# Patient Record
Sex: Male | Born: 1965
Health system: Southern US, Community
[De-identification: ages and names within clinical notes are randomized; demographics above are authoritative.]

## PROBLEM LIST (undated history)

## (undated) DIAGNOSIS — M069 Rheumatoid arthritis, unspecified: Secondary | ICD-10-CM

## (undated) DIAGNOSIS — M1612 Unilateral primary osteoarthritis, left hip: Secondary | ICD-10-CM

## (undated) DIAGNOSIS — Z96652 Presence of left artificial knee joint: Principal | ICD-10-CM

## (undated) DIAGNOSIS — I10 Essential (primary) hypertension: Secondary | ICD-10-CM

## (undated) DIAGNOSIS — T4145XA Adverse effect of unspecified anesthetic, initial encounter: Secondary | ICD-10-CM

## (undated) DIAGNOSIS — M1990 Unspecified osteoarthritis, unspecified site: Secondary | ICD-10-CM

## (undated) HISTORY — PX: CARPAL TUNNEL RELEASE: SHX101

## (undated) HISTORY — DX: Presence of left artificial knee joint: Z96.652

## (undated) HISTORY — PX: ROTATOR CUFF REPAIR: SHX139

## (undated) HISTORY — DX: Rheumatoid arthritis, unspecified: M06.9

## (undated) HISTORY — PX: HIP SURGERY: SHX245

## (undated) HISTORY — DX: Unilateral primary osteoarthritis, left hip: M16.12

---

## 2002-05-15 ENCOUNTER — Encounter: Payer: Self-pay | Admitting: Emergency Medicine

## 2002-05-15 ENCOUNTER — Emergency Department (HOSPITAL_COMMUNITY): Admission: EM | Admit: 2002-05-15 | Discharge: 2002-05-15 | Payer: Self-pay | Admitting: Emergency Medicine

## 2002-05-28 ENCOUNTER — Emergency Department (HOSPITAL_COMMUNITY): Admission: EM | Admit: 2002-05-28 | Discharge: 2002-05-28 | Payer: Self-pay | Admitting: *Deleted

## 2006-02-13 ENCOUNTER — Encounter: Admission: RE | Admit: 2006-02-13 | Discharge: 2006-02-13 | Payer: Self-pay | Admitting: Family Medicine

## 2006-03-10 ENCOUNTER — Encounter (INDEPENDENT_AMBULATORY_CARE_PROVIDER_SITE_OTHER): Payer: Self-pay | Admitting: Internal Medicine

## 2007-05-27 ENCOUNTER — Ambulatory Visit: Payer: Self-pay | Admitting: Internal Medicine

## 2007-07-04 ENCOUNTER — Ambulatory Visit: Payer: Self-pay | Admitting: *Deleted

## 2007-07-04 ENCOUNTER — Ambulatory Visit: Payer: Self-pay | Admitting: Internal Medicine

## 2007-07-04 DIAGNOSIS — M25569 Pain in unspecified knee: Secondary | ICD-10-CM | POA: Insufficient documentation

## 2007-07-04 DIAGNOSIS — G5600 Carpal tunnel syndrome, unspecified upper limb: Secondary | ICD-10-CM | POA: Insufficient documentation

## 2007-07-04 DIAGNOSIS — I10 Essential (primary) hypertension: Secondary | ICD-10-CM | POA: Insufficient documentation

## 2007-07-28 ENCOUNTER — Ambulatory Visit: Payer: Self-pay | Admitting: Nurse Practitioner

## 2007-07-28 DIAGNOSIS — M4714 Other spondylosis with myelopathy, thoracic region: Secondary | ICD-10-CM

## 2007-07-28 DIAGNOSIS — M5020 Other cervical disc displacement, unspecified cervical region: Secondary | ICD-10-CM | POA: Insufficient documentation

## 2007-08-18 ENCOUNTER — Ambulatory Visit: Payer: Self-pay | Admitting: Nurse Practitioner

## 2007-09-05 ENCOUNTER — Ambulatory Visit: Payer: Self-pay | Admitting: Nurse Practitioner

## 2007-09-05 LAB — CONVERTED CEMR LAB
ALT: 26 units/L (ref 0–53)
AST: 20 units/L (ref 0–37)
Alkaline Phosphatase: 67 units/L (ref 39–117)
Basophils Absolute: 0 10*3/uL (ref 0.0–0.1)
Basophils Relative: 0 % (ref 0–1)
Eosinophils Absolute: 0.1 10*3/uL (ref 0.0–0.7)
Eosinophils Relative: 1 % (ref 0–5)
HCT: 42.6 % (ref 39.0–52.0)
LDL Cholesterol: 92 mg/dL (ref 0–99)
Lymphocytes Relative: 36 % (ref 12–46)
MCHC: 33.6 g/dL (ref 30.0–36.0)
MCV: 80.8 fL (ref 78.0–100.0)
Platelets: 298 10*3/uL (ref 150–400)
RDW: 14.6 % — ABNORMAL HIGH (ref 11.5–14.0)
Sodium: 141 meq/L (ref 135–145)
Total Bilirubin: 0.7 mg/dL (ref 0.3–1.2)
Total CHOL/HDL Ratio: 4.4
Total Protein: 7.2 g/dL (ref 6.0–8.3)
VLDL: 38 mg/dL (ref 0–40)

## 2007-09-08 ENCOUNTER — Encounter (INDEPENDENT_AMBULATORY_CARE_PROVIDER_SITE_OTHER): Payer: Self-pay | Admitting: Nurse Practitioner

## 2007-10-20 ENCOUNTER — Ambulatory Visit: Payer: Self-pay | Admitting: Nurse Practitioner

## 2007-10-20 DIAGNOSIS — G44229 Chronic tension-type headache, not intractable: Secondary | ICD-10-CM

## 2007-10-31 ENCOUNTER — Telehealth (INDEPENDENT_AMBULATORY_CARE_PROVIDER_SITE_OTHER): Payer: Self-pay | Admitting: Nurse Practitioner

## 2007-11-03 ENCOUNTER — Telehealth (INDEPENDENT_AMBULATORY_CARE_PROVIDER_SITE_OTHER): Payer: Self-pay | Admitting: Nurse Practitioner

## 2008-04-09 ENCOUNTER — Ambulatory Visit: Payer: Self-pay | Admitting: Nurse Practitioner

## 2008-04-09 DIAGNOSIS — M2550 Pain in unspecified joint: Secondary | ICD-10-CM | POA: Insufficient documentation

## 2008-04-09 DIAGNOSIS — M25559 Pain in unspecified hip: Secondary | ICD-10-CM

## 2008-04-12 ENCOUNTER — Encounter (INDEPENDENT_AMBULATORY_CARE_PROVIDER_SITE_OTHER): Payer: Self-pay | Admitting: Nurse Practitioner

## 2008-04-12 LAB — CONVERTED CEMR LAB
Anti Nuclear Antibody(ANA): NEGATIVE
Basophils Relative: 0 % (ref 0–1)
Lymphs Abs: 3.3 10*3/uL (ref 0.7–4.0)
Monocytes Relative: 7 % (ref 3–12)
Neutro Abs: 5.6 10*3/uL (ref 1.7–7.7)
Neutrophils Relative %: 58 % (ref 43–77)
Platelets: 301 10*3/uL (ref 150–400)
RBC: 5.55 M/uL (ref 4.22–5.81)
Rhuematoid fact SerPl-aCnc: 176 intl units/mL — ABNORMAL HIGH (ref 0–20)
WBC: 9.7 10*3/uL (ref 4.0–10.5)

## 2008-04-29 ENCOUNTER — Ambulatory Visit: Payer: Self-pay | Admitting: Nurse Practitioner

## 2008-04-29 LAB — CONVERTED CEMR LAB
CRP: 1 mg/dL — ABNORMAL HIGH (ref <0.6)
Hep A Total Ab: NEGATIVE
ds DNA Ab: <1 (ref <5)

## 2008-05-04 ENCOUNTER — Encounter (INDEPENDENT_AMBULATORY_CARE_PROVIDER_SITE_OTHER): Payer: Self-pay | Admitting: Nurse Practitioner

## 2008-05-06 ENCOUNTER — Encounter: Admission: RE | Admit: 2008-05-06 | Discharge: 2008-05-06 | Payer: Self-pay | Admitting: Internal Medicine

## 2008-05-20 ENCOUNTER — Encounter (INDEPENDENT_AMBULATORY_CARE_PROVIDER_SITE_OTHER): Payer: Self-pay | Admitting: Nurse Practitioner

## 2008-05-24 ENCOUNTER — Ambulatory Visit: Payer: Self-pay | Admitting: Nurse Practitioner

## 2008-08-04 ENCOUNTER — Ambulatory Visit: Payer: Self-pay | Admitting: Nurse Practitioner

## 2008-08-12 ENCOUNTER — Ambulatory Visit: Payer: Self-pay | Admitting: Nurse Practitioner

## 2008-10-06 ENCOUNTER — Encounter (INDEPENDENT_AMBULATORY_CARE_PROVIDER_SITE_OTHER): Payer: Self-pay | Admitting: Nurse Practitioner

## 2008-10-28 ENCOUNTER — Ambulatory Visit: Payer: Self-pay | Admitting: Nurse Practitioner

## 2008-10-28 DIAGNOSIS — R609 Edema, unspecified: Secondary | ICD-10-CM

## 2008-11-01 ENCOUNTER — Encounter (INDEPENDENT_AMBULATORY_CARE_PROVIDER_SITE_OTHER): Payer: Self-pay | Admitting: Nurse Practitioner

## 2008-11-01 LAB — CONVERTED CEMR LAB
ALT: 25 U/L
AST: 19 U/L
Albumin: 4.3 g/dL
Alkaline Phosphatase: 65 U/L
BUN: 15 mg/dL
Basophils Absolute: 0 10*3/uL
Basophils Relative: 0 %
CO2: 27 meq/L
Calcium: 10.1 mg/dL
Chloride: 103 meq/L
Creatinine, Ser: 0.88 mg/dL
Eosinophils Absolute: 0.1 10*3/uL
Eosinophils Relative: 1 %
Glucose, Bld: 119 mg/dL — ABNORMAL HIGH
HCT: 43.9 %
Hemoglobin: 14.1 g/dL
Lymphocytes Relative: 42 %
Lymphs Abs: 3.3 10*3/uL
MCHC: 32.1 g/dL
MCV: 83.5 fL
Monocytes Absolute: 0.7 10*3/uL
Monocytes Relative: 9 %
Neutro Abs: 3.8 10*3/uL
Neutrophils Relative %: 49 %
Platelets: 303 10*3/uL
Potassium: 3.9 meq/L
RBC: 5.26 M/uL
RDW: 14.2 %
Sodium: 141 meq/L
TSH: 0.957 u[IU]/mL
Total Bilirubin: 0.7 mg/dL
Total Protein: 7.3 g/dL
WBC: 7.9 10*3/uL

## 2008-11-02 ENCOUNTER — Encounter (INDEPENDENT_AMBULATORY_CARE_PROVIDER_SITE_OTHER): Payer: Self-pay | Admitting: Nurse Practitioner

## 2008-11-02 DIAGNOSIS — E119 Type 2 diabetes mellitus without complications: Secondary | ICD-10-CM

## 2008-11-02 LAB — CONVERTED CEMR LAB: Hgb A1c MFr Bld: 6.6 % — ABNORMAL HIGH (ref 4.6–6.1)

## 2008-11-08 ENCOUNTER — Encounter (INDEPENDENT_AMBULATORY_CARE_PROVIDER_SITE_OTHER): Payer: Self-pay | Admitting: Nurse Practitioner

## 2008-11-09 ENCOUNTER — Encounter (INDEPENDENT_AMBULATORY_CARE_PROVIDER_SITE_OTHER): Payer: Self-pay | Admitting: Nurse Practitioner

## 2008-11-16 ENCOUNTER — Encounter (INDEPENDENT_AMBULATORY_CARE_PROVIDER_SITE_OTHER): Payer: Self-pay | Admitting: Nurse Practitioner

## 2008-12-29 ENCOUNTER — Telehealth (INDEPENDENT_AMBULATORY_CARE_PROVIDER_SITE_OTHER): Payer: Self-pay | Admitting: Nurse Practitioner

## 2008-12-31 ENCOUNTER — Ambulatory Visit: Payer: Self-pay | Admitting: Nurse Practitioner

## 2008-12-31 LAB — CONVERTED CEMR LAB: Blood Glucose, Fingerstick: 108

## 2009-02-08 ENCOUNTER — Ambulatory Visit: Payer: Self-pay | Admitting: Nurse Practitioner

## 2009-02-08 LAB — CONVERTED CEMR LAB: Blood Glucose, Fingerstick: 105

## 2009-02-11 ENCOUNTER — Encounter (INDEPENDENT_AMBULATORY_CARE_PROVIDER_SITE_OTHER): Payer: Self-pay | Admitting: Nurse Practitioner

## 2009-02-15 ENCOUNTER — Encounter: Admission: RE | Admit: 2009-02-15 | Discharge: 2009-04-20 | Payer: Self-pay | Admitting: Internal Medicine

## 2009-02-15 ENCOUNTER — Encounter (INDEPENDENT_AMBULATORY_CARE_PROVIDER_SITE_OTHER): Payer: Self-pay | Admitting: Nurse Practitioner

## 2009-02-16 ENCOUNTER — Encounter (INDEPENDENT_AMBULATORY_CARE_PROVIDER_SITE_OTHER): Payer: Self-pay | Admitting: Nurse Practitioner

## 2009-02-25 ENCOUNTER — Encounter (INDEPENDENT_AMBULATORY_CARE_PROVIDER_SITE_OTHER): Payer: Self-pay | Admitting: Nurse Practitioner

## 2009-03-08 ENCOUNTER — Encounter (INDEPENDENT_AMBULATORY_CARE_PROVIDER_SITE_OTHER): Payer: Self-pay | Admitting: Nurse Practitioner

## 2009-03-10 ENCOUNTER — Ambulatory Visit: Payer: Self-pay | Admitting: Nurse Practitioner

## 2009-03-10 LAB — CONVERTED CEMR LAB
BUN: 14 mg/dL (ref 6–23)
Creatinine, Ser: 0.84 mg/dL (ref 0.40–1.50)
Glucose, Bld: 100 mg/dL — ABNORMAL HIGH (ref 70–99)
Potassium: 3.9 meq/L (ref 3.5–5.3)

## 2009-03-11 ENCOUNTER — Encounter (INDEPENDENT_AMBULATORY_CARE_PROVIDER_SITE_OTHER): Payer: Self-pay | Admitting: Nurse Practitioner

## 2009-03-15 ENCOUNTER — Encounter: Admission: RE | Admit: 2009-03-15 | Discharge: 2009-03-15 | Payer: Self-pay | Admitting: Internal Medicine

## 2009-03-30 ENCOUNTER — Telehealth (INDEPENDENT_AMBULATORY_CARE_PROVIDER_SITE_OTHER): Payer: Self-pay | Admitting: Nurse Practitioner

## 2009-04-06 ENCOUNTER — Encounter (INDEPENDENT_AMBULATORY_CARE_PROVIDER_SITE_OTHER): Payer: Self-pay | Admitting: Nurse Practitioner

## 2009-05-26 ENCOUNTER — Encounter (INDEPENDENT_AMBULATORY_CARE_PROVIDER_SITE_OTHER): Payer: Self-pay | Admitting: Nurse Practitioner

## 2009-06-15 ENCOUNTER — Encounter (INDEPENDENT_AMBULATORY_CARE_PROVIDER_SITE_OTHER): Payer: Self-pay | Admitting: Nurse Practitioner

## 2009-06-15 ENCOUNTER — Ambulatory Visit (HOSPITAL_COMMUNITY): Admission: RE | Admit: 2009-06-15 | Discharge: 2009-06-15 | Payer: Self-pay | Admitting: Gastroenterology

## 2009-06-24 ENCOUNTER — Ambulatory Visit: Payer: Self-pay | Admitting: Nurse Practitioner

## 2009-06-24 LAB — CONVERTED CEMR LAB
Blood Glucose, Fingerstick: 135
Glucose, Urine, Semiquant: NEGATIVE
HDL: 55 mg/dL (ref >39)
LDL Cholesterol: 137 mg/dL — ABNORMAL HIGH (ref 0–99)
Microalb, Ur: 17.01 mg/dL — ABNORMAL HIGH (ref 0.00–1.89)
Nitrite: NEGATIVE
Specific Gravity, Urine: 1.025
Triglycerides: 68 mg/dL (ref <150)
VLDL: 14 mg/dL (ref 0–40)
WBC Urine, dipstick: NEGATIVE
pH: 5.5

## 2009-06-27 ENCOUNTER — Encounter (INDEPENDENT_AMBULATORY_CARE_PROVIDER_SITE_OTHER): Payer: Self-pay | Admitting: Nurse Practitioner

## 2009-06-29 ENCOUNTER — Encounter (INDEPENDENT_AMBULATORY_CARE_PROVIDER_SITE_OTHER): Payer: Self-pay | Admitting: Nurse Practitioner

## 2009-06-30 ENCOUNTER — Ambulatory Visit: Payer: Self-pay | Admitting: Nurse Practitioner

## 2009-07-15 ENCOUNTER — Ambulatory Visit: Payer: Self-pay | Admitting: Nurse Practitioner

## 2009-08-01 ENCOUNTER — Ambulatory Visit: Payer: Self-pay | Admitting: Nurse Practitioner

## 2009-08-08 ENCOUNTER — Encounter (INDEPENDENT_AMBULATORY_CARE_PROVIDER_SITE_OTHER): Payer: Self-pay | Admitting: Nurse Practitioner

## 2009-08-15 ENCOUNTER — Ambulatory Visit: Payer: Self-pay | Admitting: Nurse Practitioner

## 2009-08-15 LAB — CONVERTED CEMR LAB
BUN: 12 mg/dL (ref 6–23)
Creatinine, Ser: 0.81 mg/dL (ref 0.40–1.50)

## 2009-08-16 ENCOUNTER — Encounter (INDEPENDENT_AMBULATORY_CARE_PROVIDER_SITE_OTHER): Payer: Self-pay | Admitting: Nurse Practitioner

## 2009-08-29 ENCOUNTER — Ambulatory Visit: Payer: Self-pay | Admitting: Physician Assistant

## 2009-09-06 ENCOUNTER — Telehealth (INDEPENDENT_AMBULATORY_CARE_PROVIDER_SITE_OTHER): Payer: Self-pay | Admitting: Nurse Practitioner

## 2009-10-12 ENCOUNTER — Ambulatory Visit: Payer: Self-pay | Admitting: Nurse Practitioner

## 2009-10-12 DIAGNOSIS — M25529 Pain in unspecified elbow: Secondary | ICD-10-CM

## 2009-10-12 LAB — CONVERTED CEMR LAB
Blood Glucose, Fingerstick: 107
Hgb A1c MFr Bld: 6.2 %

## 2009-12-02 ENCOUNTER — Ambulatory Visit: Payer: Self-pay | Admitting: Nurse Practitioner

## 2009-12-02 LAB — CONVERTED CEMR LAB: Cholesterol: 162 mg/dL (ref 0–200)

## 2009-12-07 ENCOUNTER — Encounter (INDEPENDENT_AMBULATORY_CARE_PROVIDER_SITE_OTHER): Payer: Self-pay | Admitting: Nurse Practitioner

## 2009-12-14 ENCOUNTER — Encounter (INDEPENDENT_AMBULATORY_CARE_PROVIDER_SITE_OTHER): Payer: Self-pay | Admitting: Nurse Practitioner

## 2010-01-10 ENCOUNTER — Ambulatory Visit: Payer: Self-pay | Admitting: Nurse Practitioner

## 2010-01-10 DIAGNOSIS — H579 Unspecified disorder of eye and adnexa: Secondary | ICD-10-CM

## 2010-01-10 LAB — CONVERTED CEMR LAB
Blood Glucose, Fingerstick: 101
Rapid HIV Screen: NEGATIVE

## 2010-01-11 ENCOUNTER — Encounter (INDEPENDENT_AMBULATORY_CARE_PROVIDER_SITE_OTHER): Payer: Self-pay | Admitting: Nurse Practitioner

## 2010-01-11 LAB — CONVERTED CEMR LAB
Basophils Absolute: 0 10*3/uL (ref 0.0–0.1)
Cholesterol: 190 mg/dL (ref 0–200)
HCT: 44.7 % (ref 39.0–52.0)
HDL: 46 mg/dL (ref >39)
Hemoglobin: 14.6 g/dL (ref 13.0–17.0)
Lymphocytes Relative: 39 % (ref 12–46)
Lymphs Abs: 3.2 10*3/uL (ref 0.7–4.0)
Monocytes Absolute: 0.5 10*3/uL (ref 0.1–1.0)
Monocytes Relative: 6 % (ref 3–12)
Neutro Abs: 4.4 10*3/uL (ref 1.7–7.7)
RBC: 5.51 M/uL (ref 4.22–5.81)
Total CHOL/HDL Ratio: 4.1
Triglycerides: 108 mg/dL (ref <150)
VLDL: 22 mg/dL (ref 0–40)
WBC: 8.1 10*3/uL (ref 4.0–10.5)

## 2010-03-01 ENCOUNTER — Ambulatory Visit: Payer: Self-pay | Admitting: Nurse Practitioner

## 2010-03-01 DIAGNOSIS — N529 Male erectile dysfunction, unspecified: Secondary | ICD-10-CM | POA: Insufficient documentation

## 2010-03-01 LAB — CONVERTED CEMR LAB: Blood Glucose, Fingerstick: 120

## 2010-04-12 ENCOUNTER — Ambulatory Visit: Payer: Self-pay | Admitting: Nurse Practitioner

## 2010-04-13 ENCOUNTER — Encounter (INDEPENDENT_AMBULATORY_CARE_PROVIDER_SITE_OTHER): Payer: Self-pay | Admitting: Nurse Practitioner

## 2010-06-07 ENCOUNTER — Ambulatory Visit: Payer: Self-pay | Admitting: Nurse Practitioner

## 2010-06-07 LAB — CONVERTED CEMR LAB: Blood Glucose, Fingerstick: 104

## 2010-06-26 ENCOUNTER — Ambulatory Visit: Payer: Self-pay | Admitting: Nurse Practitioner

## 2010-06-26 DIAGNOSIS — R109 Unspecified abdominal pain: Secondary | ICD-10-CM

## 2010-06-26 LAB — CONVERTED CEMR LAB
AST: 20 units/L (ref 0–37)
Albumin: 4.2 g/dL (ref 3.5–5.2)
Alkaline Phosphatase: 70 units/L (ref 39–117)
BUN: 13 mg/dL (ref 6–23)
Blood Glucose, Fingerstick: 161
Creatinine, Ser: 0.77 mg/dL (ref 0.40–1.50)
Glucose, Urine, Semiquant: NEGATIVE
Ketones, urine, test strip: NEGATIVE
Nitrite: NEGATIVE
Potassium: 4 meq/L (ref 3.5–5.3)
Specific Gravity, Urine: 1.025

## 2010-06-27 ENCOUNTER — Encounter (INDEPENDENT_AMBULATORY_CARE_PROVIDER_SITE_OTHER): Payer: Self-pay | Admitting: Nurse Practitioner

## 2010-07-12 ENCOUNTER — Telehealth (INDEPENDENT_AMBULATORY_CARE_PROVIDER_SITE_OTHER): Payer: Self-pay | Admitting: Nurse Practitioner

## 2010-07-13 ENCOUNTER — Emergency Department (HOSPITAL_COMMUNITY): Admission: EM | Admit: 2010-07-13 | Discharge: 2010-07-13 | Payer: Self-pay | Admitting: Emergency Medicine

## 2010-07-13 ENCOUNTER — Ambulatory Visit: Payer: Self-pay | Admitting: Nurse Practitioner

## 2010-07-13 DIAGNOSIS — R5383 Other fatigue: Secondary | ICD-10-CM

## 2010-07-13 DIAGNOSIS — F985 Adult onset fluency disorder: Secondary | ICD-10-CM

## 2010-07-13 DIAGNOSIS — R5381 Other malaise: Secondary | ICD-10-CM

## 2010-07-17 ENCOUNTER — Encounter (INDEPENDENT_AMBULATORY_CARE_PROVIDER_SITE_OTHER): Payer: Self-pay | Admitting: Nurse Practitioner

## 2010-07-17 ENCOUNTER — Telehealth (INDEPENDENT_AMBULATORY_CARE_PROVIDER_SITE_OTHER): Payer: Self-pay | Admitting: Nurse Practitioner

## 2010-07-21 ENCOUNTER — Encounter (INDEPENDENT_AMBULATORY_CARE_PROVIDER_SITE_OTHER): Payer: Self-pay | Admitting: Nurse Practitioner

## 2010-08-01 ENCOUNTER — Ambulatory Visit: Payer: Self-pay | Admitting: Nurse Practitioner

## 2010-08-01 DIAGNOSIS — J383 Other diseases of vocal cords: Secondary | ICD-10-CM

## 2010-08-01 LAB — CONVERTED CEMR LAB: Blood Glucose, Fingerstick: 96

## 2010-08-04 ENCOUNTER — Encounter (INDEPENDENT_AMBULATORY_CARE_PROVIDER_SITE_OTHER): Payer: Self-pay | Admitting: Nurse Practitioner

## 2010-08-16 ENCOUNTER — Ambulatory Visit: Payer: Self-pay | Admitting: Nurse Practitioner

## 2010-08-30 ENCOUNTER — Encounter (INDEPENDENT_AMBULATORY_CARE_PROVIDER_SITE_OTHER): Payer: Self-pay | Admitting: Nurse Practitioner

## 2010-09-04 ENCOUNTER — Encounter (INDEPENDENT_AMBULATORY_CARE_PROVIDER_SITE_OTHER): Payer: Self-pay | Admitting: Nurse Practitioner

## 2010-09-11 ENCOUNTER — Encounter (INDEPENDENT_AMBULATORY_CARE_PROVIDER_SITE_OTHER): Payer: Self-pay | Admitting: Nurse Practitioner

## 2010-09-27 ENCOUNTER — Ambulatory Visit: Payer: Self-pay | Admitting: Nurse Practitioner

## 2010-09-27 LAB — CONVERTED CEMR LAB: Blood Glucose, Fingerstick: 121

## 2010-09-29 ENCOUNTER — Encounter (INDEPENDENT_AMBULATORY_CARE_PROVIDER_SITE_OTHER): Payer: Self-pay | Admitting: Nurse Practitioner

## 2010-10-03 ENCOUNTER — Encounter: Admission: RE | Admit: 2010-10-03 | Discharge: 2010-10-26 | Payer: Self-pay | Source: Home / Self Care

## 2010-10-12 ENCOUNTER — Encounter (INDEPENDENT_AMBULATORY_CARE_PROVIDER_SITE_OTHER): Payer: Self-pay | Admitting: Nurse Practitioner

## 2010-10-12 ENCOUNTER — Telehealth (INDEPENDENT_AMBULATORY_CARE_PROVIDER_SITE_OTHER): Payer: Self-pay | Admitting: *Deleted

## 2010-10-31 ENCOUNTER — Encounter: Admission: RE | Admit: 2010-10-31 | Discharge: 2010-11-25 | Payer: Self-pay | Source: Home / Self Care

## 2010-10-31 ENCOUNTER — Encounter (INDEPENDENT_AMBULATORY_CARE_PROVIDER_SITE_OTHER): Payer: Self-pay | Admitting: Nurse Practitioner

## 2010-11-07 ENCOUNTER — Encounter: Admit: 2010-11-07 | Payer: Self-pay

## 2010-11-14 ENCOUNTER — Encounter (INDEPENDENT_AMBULATORY_CARE_PROVIDER_SITE_OTHER): Payer: Self-pay | Admitting: Nurse Practitioner

## 2010-11-19 ENCOUNTER — Encounter: Payer: Self-pay | Admitting: Internal Medicine

## 2010-11-27 ENCOUNTER — Ambulatory Visit
Admission: RE | Admit: 2010-11-27 | Discharge: 2010-11-27 | Payer: Self-pay | Source: Home / Self Care | Attending: Nurse Practitioner | Admitting: Nurse Practitioner

## 2010-11-27 ENCOUNTER — Encounter (INDEPENDENT_AMBULATORY_CARE_PROVIDER_SITE_OTHER): Payer: Self-pay | Admitting: Nurse Practitioner

## 2010-11-27 LAB — CONVERTED CEMR LAB
Glucose, Urine, Semiquant: NEGATIVE
Hgb A1c MFr Bld: 6.2 %
Specific Gravity, Urine: 1.02
WBC Urine, dipstick: NEGATIVE
pH: 7

## 2010-11-28 NOTE — Letter (Signed)
Summary: Generic Letter  [facility]  [address] [place], [place] [address]   Phone: [phone]  Fax: [phone]    06/07/2010  Curtis Mendez [address] [place], [place]  [address]  To Whom it May concern:  Curtis Mendez is an established patient in this office.  He is alert, oriented and neurologically intact.  It has been determined that he is able to make decisions regarding his finances and well being. He does NOT need a payee.  Contact this office if you have any further questions or concerns.     Sincerely,   Lehman Prom FNP [facility]

## 2010-11-28 NOTE — Assessment & Plan Note (Signed)
Summary: 2 WEEK FU FOR BP CHECK//KT  Nurse Visit   Vital Signs:  Patient profile:   45 year old male Pulse rate:   76 / minute Pulse rhythm:   regular Resp:     20 per minute BP sitting:   110 / 80  (left arm) Cuff size:   large  Vitals Entered By: Dutch Quint RN (August 16, 2010 9:44 AM)  Impression & Recommendations:  Problem # 1:  HYPERTENSION, BENIGN ESSENTIAL (ICD-401.1) Took all his medications BP good -- 100/80 Continue meds F/U as scheduled  His updated medication list for this problem includes:    Metoprolol Tartrate 100 Mg Tabs (Metoprolol tartrate) ..... One tablet by mouth two times a day for blood pressure    Lisinopril-hydrochlorothiazide 20-25 Mg Tabs (Lisinopril-hydrochlorothiazide) ..... One tablet by mouth daily for blood pressure    Norvasc 5 Mg Tabs (Amlodipine besylate) .Marland Kitchen... Take one tablet by mouth daily for blood pressure  Complete Medication List: 1)  Metoprolol Tartrate 100 Mg Tabs (Metoprolol tartrate) .... One tablet by mouth two times a day for blood pressure 2)  Omeprazole 20 Mg Cpdr (Omeprazole) .... One tablet by mouth two times a day before meals 3)  Lisinopril-hydrochlorothiazide 20-25 Mg Tabs (Lisinopril-hydrochlorothiazide) .... One tablet by mouth daily for blood pressure 4)  Glucophage Xr 500 Mg Xr24h-tab (Metformin hcl) .Marland Kitchen.. 1 tablet by mouth daily for blood sugar 5)  Glucometer Elite Classic Kit (Blood glucose monitoring suppl) .... Dispense glucometer to check blood sugar daily dx 250.00 6)  Glucometer Elite Test Strp (Glucose blood) .... Use to test blood sugar once daily before breakfast dx 250.00 7)  Norvasc 5 Mg Tabs (Amlodipine besylate) .... Take one tablet by mouth daily for blood pressure 8)  Viagra 100 Mg Tabs (Sildenafil citrate) .... One tablet by mouth 30 minutes  before sexual activity 9)  Voltaren 1 % Gel (Diclofenac sodium) .... Apply 4gm to left hip topically two times a day as needed 10)  Topamax 100 Mg Tabs  (Topiramate) .... 2 tablets by mouth daily for headaches 11)  Nortriptyline Hcl 10 Mg Caps (Nortriptyline hcl) .... 3 capsules by mouth nightly 12)  Vicodin 5-500 Mg Tabs (Hydrocodone-acetaminophen) .... One tablet by mouth daily as needed pain   Review of Systems CV:  Complains of swelling of feet; denies bluish discoloration of lips or nails, chest pain or discomfort, difficulty breathing at night, difficulty breathing while lying down, fainting, fatigue, leg cramps with exertion, lightheadness, near fainting, palpitations, shortness of breath with exertion, swelling of hands, and weight gain; Denies cough, headache or visual changes.   Patient Instructions: 1)  Your blood pressure is good -- 110/80. 2)  Continue medications as ordered. 3)  Keep appointment on Monday 08/21/10 with Dr. Jan Fireman. 4)  Keep appointment with provider on 09/27/10 as scheduled. 5)  Call if anything changes or if you have any questions.   Physical Exam  Lungs:  normal respiratory effort, normal breath sounds, no crackles, and no wheezes.   Heart:  normal rate and regular rhythm.     CC:  BP check.  History of Present Illness: Took all his medications this morning.  States asymptomatic.  CC: BP check Is Patient Diabetic? Yes Did you bring your meter with you today? No Pain Assessment Patient in pain? yes     Location: left hip Intensity: 10 Type: sharp Onset of pain  Constant   Allergies: No Known Drug Allergies  Orders Added: 1)  Est. Patient Level I [21308]

## 2010-11-28 NOTE — Letter (Signed)
Summary: *HSN Results Follow up  [facility]  [address] [place], [place] [address]   Phone: [phone]  Fax: [phone]      04/13/2010   Corin A Ruybal [address] [place], [place]  [address]   Dear  Mr. Calyb Wolfrey,                            ____S.Drinkard,FNP   ____D. Gore,FNP       ____B. McPherson,MD   ____V. Rankins,MD    ____E. Mulberry,MD    _X___N. Daphine Deutscher, FNP  ____D. Reche Dixon, MD    ____K. Philipp Deputy, MD    ____Other     This letter is to inform you that your recent test(s):  _______Pap Smear    ___X____Lab Test     _______X-ray    ___X___ is within acceptable limits  _______ requires a medication change  _______ requires a follow-up lab visit  _______ requires a follow-up visit with your provider   Comments: Your Hbga1c = 6.2 during your recent office visit.  Remember this value should be less than 7.  A value of 6.2 means your diabetes is controlled.  Continue your current medications and keep up your efforts at diet and exercise.     _________________________________________________________ If you have any questions, please contact our office [phone].                    Sincerely,    Lehman Prom FNP [facility]

## 2010-11-28 NOTE — Progress Notes (Signed)
  Phone Note Outgoing Call   Summary of Call: CALLED PT TO SEE IF HE WAS ACUTLY ILL IF NOT HE CAN CANCELL APP FOR    9/15/ AND GO OUT 8 MORE WEEKS.HE WAS JUST SEEN 8 29/11 THIS IS PER [facility] Initial call taken by: Arta Bruce,  July 12, 2010 3:14 PM  Follow-up for Phone Call        MR [place] CALLED BACK AND SAYS THAT HE IS GOING TO KEEP HIS APPOINTMENT FOR TOMORROW. Follow-up by: Leodis Rains,  July 12, 2010 4:10 PM

## 2010-11-28 NOTE — Assessment & Plan Note (Signed)
Summary: Diabetes/HTN   Vital Signs:  Patient profile:   45 year old male Weight:      305.1 pounds Temp:     98.2 degrees F oral Pulse rate:   90 / minute Pulse rhythm:   regular Resp:     20 per minute Cuff size:   large  Vitals Entered By: Levon Hedger (April 12, 2010 9:02 AM) CC: follow-up visit DM, Hypertension Management, Abdominal Pain Is Patient Diabetic? Yes Pain Assessment Patient in pain? no      CBG Result 132 CBG Device ID A  Does patient need assistance? Functional Status Self care Ambulation Normal   CC:  follow-up visit DM, Hypertension Management, and Abdominal Pain.  History of Present Illness:  pt into the office for follow up on diabetes  Obesity - Down 3 pounds since his last visit. Pt is making a hard effort to decrease the weight  Diabetes Management History:      The patient is a 45 years old male who comes in for evaluation of Type 2 Diabetes Mellitus.  He has not been enrolled in the "Diabetic Education Program".  He states understanding of dietary principles and is following his diet appropriately.  No sensory loss is reported.  Self foot exams are not being performed.  He is not checking home blood sugars.  He says that he is exercising.        Hypoglycemic symptoms are not occurring.  No hyperglycemic symptoms are reported.  Other comments include: Pt is taking medications as ordered.        There are no symptoms to suggest diabetic complications.  No changes have been made to his treatment plan since last visit.    Dyspepsia History:      There is a prior history of GERD.  The patient does not have a prior history of documented ulcer disease.  The dominant symptom is not heartburn or acid reflux.  An H-2 blocker medication is not currently being taken.  No previous upper endoscopy has been done.    Hypertension History:      He denies headache, chest pain, and palpitations.  pt is taking medications as ordered.        Positive major  cardiovascular risk factors include diabetes and hypertension.  Negative major cardiovascular risk factors include male age less than 51 years old and non-tobacco-user status.        Further assessment for target organ damage reveals no history of ASHD, cardiac end-organ damage (CHF/LVH), stroke/TIA, peripheral vascular disease, renal insufficiency, or hypertensive retinopathy.      Habits & Providers  Alcohol-Tobacco-Diet     Alcohol drinks/day: 0     Tobacco Status: never  Exercise-Depression-Behavior     Does Patient Exercise: yes     Exercise Counseling: to improve exercise regimen     Have you felt down or hopeless? yes     Have you felt little pleasure in things? yes     Depression Counseling: not indicated; screening negative for depression     Drug Use: never     Seat Belt Use: 100     Sun Exposure: occasionally  Allergies (verified): No Known Drug Allergies  Review of Systems CV:  Denies chest pain or discomfort. Resp:  Denies cough. GI:  Denies abdominal pain, nausea, and vomiting. GU:  Complains of erectile dysfunction. MS:  Left hip - s/p ortho procedure.  Still with complaints of tingling and numbness.  Pt has tried neurontin as ordered without  benefit. Neuro:  Complains of tingling.  Physical Exam  General:  alert.  obese Head:  normocephalic.   Lungs:  normal breath sounds.   Heart:  normal rate and regular rhythm.   Neurologic:  gait normal.   Skin:  normal Psych:  Oriented X3.    Diabetes Management Exam:    Foot Exam (with socks and/or shoes not present):       Sensory-Monofilament:          Left foot: normal          Right foot: normal       Nails:          Left foot: normal          Right foot: normal   Impression & Recommendations:  Problem # 1:  DIABETES MELLITUS (ICD-250.00) will check Hba1c today Will notify pt of the results His updated medication list for this problem includes:    Lisinopril-hydrochlorothiazide 20-25 Mg Tabs  (Lisinopril-hydrochlorothiazide) ..... One tablet by mouth daily for blood pressure    Glucophage Xr 500 Mg Xr24h-tab (Metformin hcl) .Marland Kitchen... 1 tablet by mouth daily for blood sugar  Orders: Capillary Blood Glucose/CBG (82948) Hgb A1C (16967EL)  Problem # 2:  HYPERTENSION, BENIGN ESSENTIAL (ICD-401.1)  His updated medication list for this problem includes:    Metoprolol Tartrate 100 Mg Tabs (Metoprolol tartrate) ..... One tablet by mouth two times a day for blood pressure    Lisinopril-hydrochlorothiazide 20-25 Mg Tabs (Lisinopril-hydrochlorothiazide) ..... One tablet by mouth daily for blood pressure    Norvasc 5 Mg Tabs (Amlodipine besylate) .Marland Kitchen... 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 One tablet by mouth two times a day for blood pressure 2)  Omeprazole 20 Mg Cpdr (Omeprazole) .... One tablet by mouth two times a day before meals 3)  Lisinopril-hydrochlorothiazide 20-25 Mg Tabs (Lisinopril-hydrochlorothiazide) .... One tablet by mouth daily for blood pressure 4)  Glucophage Xr 500 Mg Xr24h-tab (Metformin hcl) .Marland Kitchen.. 1 tablet by mouth daily for blood sugar 5)  Glucometer Elite Classic Kit (Blood glucose monitoring suppl) .... Dispense glucometer to check blood sugar daily dx 250.00 6)  Glucometer Elite Test Strp (Glucose blood) .... Use to test blood sugar once daily before breakfast dx 250.00 7)  Norvasc 5 Mg Tabs (Amlodipine besylate) .... Take one tablet by  mouth daily for blood pressure 8)  Gabapentin 300 Mg Caps (Gabapentin) .... Three capsules by mouth nightly for left hip 9)  Cialis 5 Mg Tabs (Tadalafil) .... Two tablets by mouth as needed 30 minutes before sexual activity  Diabetes Management Assessment/Plan:      His blood pressure goal is < 130/80.    Hypertension Assessment/Plan:      The patient's hypertensive risk group is category C: Target organ damage and/or diabetes.  His calculated 10 year risk of coronary heart disease is 11 %.  His blood pressure goal is < 130/80.  Patient Instructions: 1)  Weight loss -  down 3 pounds since last visit.  Continue your efforts at exercise and diet. 2)  Blood sugar - Your Hgba1c will be checked today.  The goal is less than 7 3)  Follow up in 3 months for diabetes.    Last LDL:                                                 122 (01/11/2010 3:29:00 AM)        Diabetic Foot Exam    10-g (5.07) Semmes-Weinstein Monofilament Test Performed by: Levon Hedger          Right Foot          Left Foot Visual Inspection               Test Control      normal         normal Site 1         normal         normal Site 2         normal         normal Site 3         normal         normal Site 4         normal         normal Site 5         normal         normal Site 6         normal         normal Site 7         normal         normal Site 8         normal         normal Site 9         normal         normal Site 10         normal         normal  Impression      normal         normal  Appended Document: Diabetes/HTN   Vital Signs:  Patient profile:   45 year old male BP sitting:   134 / 87  (left arm) Cuff size:   large  Vitals Entered By: Levon Hedger (April 12, 2010 11:01 AM)

## 2010-11-28 NOTE — Progress Notes (Signed)
Summary: papers faxed  Phone Note Call from Patient   Summary of Call: PT FAXED PAPERS OVER FRIDAY/MOTHER CALLING TO SEE WERE WE GO FROM HERE//[phone] Initial call taken by: Arta Bruce,  July 17, 2010 10:11 AM  Follow-up for Phone Call        forward to N. Daphine Deutscher, fnp Follow-up by: Levon Hedger,  July 17, 2010 11:12 AM  Additional Follow-up for Phone Call Additional follow up Details #1::        1.  Pt needs neurology referral ASAP - order done. Look under order tab and print.  Call [facility] and see when pt can get an appt.  Fax office visits and other info to that office that they need 2.  Restart topamax - Rx in basket. Fax to pt's pharmacy 3.  Does pt have a f/u appt with me? if not, make within the next week Additional Follow-up by: Lehman Prom FNP,  July 18, 2010 9:21 AM    Additional Follow-up for Phone Call Additional follow up Details #2::    Appt. made for 07/31/10 per pt. request.  Rx faxed to [facility] on [address].  Records faxed to [facility]. Left message with Diane/GN to return my call to make an appt.  Dutch Quint RN  July 18, 2010 2:59 PM  Diane from [facility] called with appt. information.  Pt. is to see Dr. Anne Hahn at [facility] on Friday, July 21, 2010 at 8:30 am.  Pt. has been notified of appt. Follow-up by: Dutch Quint RN,  July 19, 2010 12:38 PM  Additional Follow-up for Phone Call Additional follow up Details #3:: Details for Additional Follow-up Action Taken: noted Additional Follow-up by: Lehman Prom FNP,  July 19, 2010 1:59 PM  New/Updated Medications: TOPAMAX 100 MG TABS (TOPIRAMATE) 2 tablets by mouth daily for headaches Prescriptions: TOPAMAX 100 MG TABS (TOPIRAMATE) 2 tablets by mouth daily for headaches  #60 x 5   Entered and Authorized by:   Lehman Prom FNP   Signed by:   Lehman Prom FNP on 07/18/2010   Method used:   Printed then faxed to ...         RxID:    [id number]

## 2010-11-28 NOTE — Letter (Signed)
Summary: *HSN Results Follow up  [facility]  [address] [place], [place] [address]   Phone: [phone]  Fax: [phone]      01/11/2010   Arvil A Smart [address] [place], [place]  [address]   Dear  Mr. Choya Chamberlin,                            ____S.Drinkard,FNP   ____D. Gore,FNP       ____B. McPherson,MD   ____V. Rankins,MD    ____E. Mulberry,MD    __X__N. Daphine Deutscher, FNP  ____D. Reche Dixon, MD    ____K. Philipp Deputy, MD    ____Other     This letter is to inform you that your recent test(s):  Labs   Comments:  Your Hgba1c (3 month blood sugar test) is 6.5.  You should stay on your blood sugar medications for now.  Your cholesterol is slightly elevated than when last checked.  You are not on any current medications.  Please be mindful of the fried fatty foods you are eating.  Your cholesterol will be rechecked in 6 months.     _________________________________________________________ If you have any questions, please contact our office [phone].                    Sincerely,    Lehman Prom FNP [facility]

## 2010-11-28 NOTE — Op Note (Signed)
Summary: COLONOSCOPY  2010  COLONOSCOPY  2010   Imported By: Arta Bruce 11/22/2009 14:06:53  _____________________________________________________________________  External Attachment:    Type:   Image     Comment:   External Document

## 2010-11-28 NOTE — Letter (Signed)
[facility]   Imported By: Arta Bruce 02/22/2010 14:24:04  _____________________________________________________________________  External Attachment:    Type:   Image     Comment:   External Document

## 2010-11-28 NOTE — Letter (Signed)
Summary: [facility] EVAL.JAKE RICKETSON,PSY.D  [facility] EVAL.JAKE RICKETSON,PSY.D   Imported By: Arta Bruce 09/27/2010 09:22:40  _____________________________________________________________________  External Attachment:    Type:   Image     Comment:   External Document

## 2010-11-28 NOTE — Letter (Signed)
Summary: REFERRAL/NEUROLOGY//APPT DATE & TIME  REFERRAL/NEUROLOGY//APPT DATE & TIME   Imported By: Arta Bruce 07/20/2010 14:39:32  _____________________________________________________________________  External Attachment:    Type:   Image     Comment:   External Document

## 2010-11-28 NOTE — Letter (Signed)
Summary: *HSN Results Follow up  [facility]  [address] [place], [place] [address]   Phone: [phone]  Fax: [phone]      06/27/2010   Lean A Bazin [address] [place], [place]  [address]   Dear  Mr. Riordan Ardito,                            ____S.Drinkard,FNP   ____D. Gore,FNP       ____B. McPherson,MD   ____V. Rankins,MD    ____E. Mulberry,MD    __X__N. Daphine Deutscher, FNP  ____D. Reche Dixon, MD    ____K. Philipp Deputy, MD    ____Other     This letter is to inform you that your recent test(s):  _______Pap Smear    ___X____Lab Test     _______X-ray    ___X____ is within acceptable limits  _______ requires a medication change  _______ requires a follow-up lab visit  _______ requires a follow-up visit with your provider   Comments: Labs done during recent office visit are normal.       _________________________________________________________ If you have any questions, please contact our office [phone].                    Sincerely,    Lehman Prom FNP [facility]

## 2010-11-28 NOTE — Letter (Signed)
Summary: THERAPY REFERRAL REQUEST  THERAPY REFERRAL REQUEST   Imported By: Arta Bruce 09/27/2010 15:11:38  _____________________________________________________________________  External Attachment:    Type:   Image     Comment:   External Document

## 2010-11-28 NOTE — Letter (Signed)
Summary: MAILED REQUESTED RECORDS TO [facility]  MAILED REQUESTED RECORDS TO [facility]   Imported By: Arta Bruce 08/04/2010 12:40:50  _____________________________________________________________________  External Attachment:    Type:   Image     Comment:   External Document

## 2010-11-28 NOTE — Letter (Signed)
Summary: Lipid Letter  [facility]  [address] [place], [place] [address]   Phone: [phone]  Fax: [phone]    12/07/2009  Curtis Mendez [address] [place], [place]  [address]  Dear [name]:  We have carefully reviewed your last lipid profile from 12/02/2009 and the results are noted below with a summary of recommendations for lipid management.    Cholesterol:       162     Goal: less than 200   HDL "good" Cholesterol:   41     Goal: greater than 40   LDL "bad" Cholesterol:   97     Goal: less than 70   Triglycerides:       119     Goal: less than 150    Your cholesterol is doing ok.    Current Medications: 1)    Metoprolol Tartrate 100 Mg Tabs (Metoprolol tartrate) .... One tablet by mouth two times a day for blood pressure 2)    Omeprazole 20 Mg Cpdr (Omeprazole) .... One tablet by mouth two times a day before meals 3)    Percocet 5-325 Mg  Tabs (Oxycodone-acetaminophen) .Marland Kitchen.. 1 tablet by mouth every 6 hours as needed for pain 4)    Topamax 100 Mg  Tabs (Topiramate) .... 2 tablets by mouth daily for headache 5)    Topamax 25 Mg  Tabs (Topiramate) .Marland Kitchen.. 1 tablet by mouth daily for headache **take in combination with 200mg  daily** 6)    Lisinopril-hydrochlorothiazide 20-25 Mg Tabs (Lisinopril-hydrochlorothiazide) .... One tablet by mouth daily for blood pressure 7)    Glucophage Xr 500 Mg Xr24h-tab (Metformin hcl) .Marland Kitchen.. 1 tablet by mouth daily for blood sugar 8)    Glucometer Elite Classic  Kit (Blood glucose monitoring suppl) .... Dispense glucometer to check blood sugar daily dx 250.00 9)    Glucometer Elite Test  Strp (Glucose blood) .... Use to test blood sugar once daily before breakfast dx 250.00 10)    Norvasc 5 Mg Tabs (Amlodipine besylate) .... Take one tablet by mouth daily for blood pressure  If you have any questions, please call. We appreciate being able to work with you.   Sincerely,    [facility] Lehman Prom FNP

## 2010-11-28 NOTE — Assessment & Plan Note (Signed)
Summary: Left hip pain   Vital Signs:  Patient profile:   45 year old male Weight:      308.9 pounds BMI:     51.59 BSA:     2.38 Temp:     97.7 degrees F oral Pulse rate:   79 / minute Pulse rhythm:   regular Resp:     20 per minute BP sitting:   144 / 82  (left arm) Cuff size:   large  Vitals Entered By: Levon Hedger (Mar 01, 2010 8:42 AM) CC: still having pain in left hip and the Gavapentin does not seem to be giving him any reliief      , Hypertension Management Is Patient Diabetic? Yes Pain Assessment Patient in pain? no      CBG Result 120 CBG Device ID A  Does patient need assistance? Functional Status Self care Ambulation Normal   CC:  still having pain in left hip and the Gavapentin does not seem to be giving him any reliief       and Hypertension Management.  History of Present Illness:  Pt into the office for follow up on left hip tingling . S/p operation on the left hip and the" bone" is doing well. Pt was started on neurontin at his last visit and did increase to two tablets by mouth nightly. Pt prefers to sleep on his left hip at night but admits that this causes more pain. Activity during the day is not restricted  Hypertension History:      He denies headache, chest pain, and palpitations.  He notes no problems with any antihypertensive medication side effects.        Positive major cardiovascular risk factors include diabetes and hypertension.  Negative major cardiovascular risk factors include male age less than 43 years old and non-tobacco-user status.        Further assessment for target organ damage reveals no history of ASHD, cardiac end-organ damage (CHF/LVH), stroke/TIA, peripheral vascular disease, renal insufficiency, or hypertensive retinopathy.    Allergies (verified): No Known Drug Allergies  Review of Systems CV:  Denies chest pain or discomfort and swelling of feet. Resp:  Denies cough. GI:  Denies abdominal pain, nausea, and  vomiting. GU:  Complains of erectile dysfunction; Pt has desire but he does is not able to perform.  He has recently reconnected with his wife and would like to perform. No sexually activity since 2007.. MS:  Complains of joint pain.  Physical Exam  General:  alert.  obese Head:  normocephalic.   Neurologic:  alert & oriented X3.   Skin:  color normal.   Psych:  Oriented X3.     Impression & Recommendations:  Problem # 1:  HIP PAIN, LEFT (ICD-719.45) advised pt to increase neurontin to 3 capsules by mouth nightly avoid sleeping on left hip if possible His updated medication list for this problem includes:    Percocet 5-325 Mg Tabs (Oxycodone-acetaminophen) .Marland Kitchen... 1 tablet by mouth every 6 hours as needed for pain  Problem # 2:  ERECTILE DYSFUNCTION, SECONDARY TO MEDICATION (ZOX-096.04)  pt has a handout with a complete review of ED so pt is well imformed will start on cialis  His updated medication list for this problem includes:    Cialis 20 Mg Tabs (Tadalafil) ..... One tablet by mouth 30 minutes before sexual activity  Problem # 3:  DIABETES MELLITUS (ICD-250.00) stable His updated medication list for this problem includes:    Lisinopril-hydrochlorothiazide 20-25 Mg Tabs (  Lisinopril-hydrochlorothiazide) ..... One tablet by mouth daily for blood pressure    Glucophage Xr 500 Mg Xr24h-tab (Metformin hcl) .Marland Kitchen... 1 tablet by mouth daily for blood sugar  Orders: Capillary Blood Glucose/CBG (16109)  Problem # 4:  HYPERTENSION, BENIGN ESSENTIAL (ICD-401.1) Bp is slightly elevated today will continue to monitor His updated medication list for this problem includes:    Metoprolol Tartrate 100 Mg Tabs (Metoprolol tartrate) ..... One tablet by mouth two times a day for blood pressure    Lisinopril-hydrochlorothiazide 20-25 Mg Tabs (Lisinopril-hydrochlorothiazide) ..... One tablet by mouth daily for blood pressure    Norvasc 5 Mg Tabs (Amlodipine besylate) .Marland Kitchen... Take one tablet by  mouth daily for blood pressure  Complete Medication List: 1)  Metoprolol Tartrate 100 Mg Tabs (Metoprolol tartrate) .... One tablet by mouth two times a day for blood pressure 2)  Omeprazole 20 Mg Cpdr (Omeprazole) .... One tablet by mouth two times a day before meals 3)  Percocet 5-325 Mg Tabs (Oxycodone-acetaminophen) .Marland Kitchen.. 1 tablet by mouth every 6 hours as needed for pain 4)  Lisinopril-hydrochlorothiazide 20-25 Mg Tabs (Lisinopril-hydrochlorothiazide) .... One tablet by mouth daily for blood pressure 5)  Glucophage Xr 500 Mg Xr24h-tab (Metformin hcl) .Marland Kitchen.. 1 tablet by mouth daily for blood sugar 6)  Glucometer Elite Classic Kit (Blood glucose monitoring suppl) .... Dispense glucometer to check blood sugar daily dx 250.00 7)  Glucometer Elite Test Strp (Glucose blood) .... Use to test blood sugar once daily before breakfast dx 250.00 8)  Norvasc 5 Mg Tabs (Amlodipine besylate) .... Take one tablet by mouth daily for blood pressure 9)  Gabapentin 300 Mg Caps (Gabapentin) .... Three capsules by mouth nightly for left hip 10)  Cialis 20 Mg Tabs (Tadalafil) .... One tablet by mouth 30 minutes before sexual activity  Hypertension Assessment/Plan:      The patient's hypertensive risk group is category C: Target organ damage and/or diabetes.  His calculated 10 year risk of coronary heart disease is 11 %.  Today's blood pressure is 144/82.  His blood pressure goal is < 130/80.   Patient Instructions: 1)  This provider will look up more information on "injection" 2)  Keep your next appointment for next month as scheduled 3)  Your blood pressure is slightly elevated today.  Will continue to monitor. 4)  Left hip pain - Increase neurontin to 3 capsules nightly Prescriptions: CIALIS 20 MG TABS (TADALAFIL) One tablet by mouth 30 minutes before sexual activity  #10 x 0   Entered and Authorized by:   Lehman Prom FNP   Signed by:   Lehman Prom FNP on 03/01/2010   Method used:   Print then Give  to Patient   RxID:   [id number]

## 2010-11-28 NOTE — Letter (Signed)
Summary: Handout Printed  Printed Handout:  Radio producer

## 2010-11-28 NOTE — Assessment & Plan Note (Signed)
Summary: Left Flank Pain   Vital Signs:  Patient profile:   45 year old male Weight:      304.7 pounds Temp:     98.3 degrees F oral Pulse rate:   72 / minute Pulse rhythm:   regular Resp:     16 per minute BP sitting:   122 / 92  (left arm) Cuff size:   large  Vitals Entered By: Levon Hedger (June 26, 2010 8:34 AM) CC: x 1 left side lower kidney pain and very light urination with a catch in his side., Hypertension Management Is Patient Diabetic? Yes Pain Assessment Patient in pain? yes     Location: lower back CBG Result 161 CBG Device ID B  Does patient need assistance? Functional Status Self care Ambulation Normal   CC:  x 1 left side lower kidney pain and very light urination with a catch in his side. and Hypertension Management.  History of Present Illness:  Pt into the office for f/u on the left flank pain. Started 1 week ago and was strongest at that time. Lots of pressure with voiding which is new for the pt. He applied an ice pack to the area which did alleviate the pain. He also drank plenty of water. Currently the pain has subsided. Urine is always clear and he has not noted blood in his urine. No fever No nausea and vomiting No previous history of kidney stones  Obesity - He has started to walk on the Treadmill for 45 minutes two times a day.  He did the activity on Mon, Tuesday, and wednesday but then pain started on Thursday and he has not used the treadmill since.  Diabetes Management History:      The patient is a 45 years old male who comes in for evaluation of Type 2 Diabetes Mellitus.  He has not been enrolled in the "Diabetic Education Program".  He states understanding of dietary principles and is following his diet appropriately.  No sensory loss is reported.  Self foot exams are not being performed.  He is not checking home blood sugars.  He says that he is exercising.        Hypoglycemic symptoms are not occurring.  No hyperglycemic  symptoms are reported.        No changes have been made to his treatment plan since last visit.    Hypertension History:      He denies headache, chest pain, and palpitations.  He notes no problems with any antihypertensive medication side effects.        Positive major cardiovascular risk factors include diabetes and hypertension.  Negative major cardiovascular risk factors include male age less than 8 years old and non-tobacco-user status.        Further assessment for target organ damage reveals no history of ASHD, cardiac end-organ damage (CHF/LVH), stroke/TIA, peripheral vascular disease, renal insufficiency, or hypertensive retinopathy.     Allergies (verified): No Known Drug Allergies  Review of Systems CV:  Denies chest pain or discomfort. Resp:  Denies cough. GI:  Denies abdominal pain; Left flank pain - improved at this time. MS:  Left flank pain - improved at this time.  Physical Exam  General:  alert.  obese Head:  normocephalic.   Lungs:  normal breath sounds.   Heart:  normal rate and regular rhythm.   Abdomen:  normal bowel sounds.   Msk:  No flank pain Neurologic:  alert & oriented X3.   Psych:  Oriented X3.  Diabetes Management Exam:    Foot Exam (with socks and/or shoes not present):       Sensory-Monofilament:          Left foot: normal          Right foot: normal   Impression & Recommendations:  Problem # 1:  FLANK PAIN, LEFT (ICD-789.09) ? small kidney stone Handout given will check labs Orders: UA Dipstick w/o Micro (manual) (04540) T-Comprehensive Metabolic Panel ([id number]) T-Uric Acid (Blood) (95621-30865)  Problem # 2:  HYPERTENSION, BENIGN ESSENTIAL (ICD-401.1) BP is stable His updated medication list for this problem includes:    Metoprolol Tartrate 100 Mg Tabs (Metoprolol tartrate) ..... One tablet by mouth two times a day for blood pressure    Lisinopril-hydrochlorothiazide 20-25 Mg Tabs (Lisinopril-hydrochlorothiazide) ..... One  tablet by mouth daily for blood pressure    Norvasc 5 Mg Tabs (Amlodipine besylate) .Marland Kitchen... Take one tablet by mouth daily for blood pressure  Orders: T-Comprehensive Metabolic Panel ([id number]) T-Uric Acid (Blood) ([id number])  Problem # 3:  DIABETES MELLITUS (ICD-250.00) His updated medication list for this problem includes:    Lisinopril-hydrochlorothiazide 20-25 Mg Tabs (Lisinopril-hydrochlorothiazide) ..... One tablet by mouth daily for blood pressure    Glucophage Xr 500 Mg Xr24h-tab (Metformin hcl) .Marland Kitchen... 1 tablet by mouth daily for blood sugar  Orders: Capillary Blood Glucose/CBG (27253)  Complete Medication List: 1)  Metoprolol Tartrate 100 Mg Tabs (Metoprolol tartrate) .... One tablet by mouth two times a day for blood pressure 2)  Omeprazole 20 Mg Cpdr (Omeprazole) .... One tablet by mouth two times a day before meals 3)  Lisinopril-hydrochlorothiazide 20-25 Mg Tabs (Lisinopril-hydrochlorothiazide) .... One tablet by mouth daily for blood pressure 4)  Glucophage Xr 500 Mg Xr24h-tab (Metformin hcl) .Marland Kitchen.. 1 tablet by mouth daily for blood sugar 5)  Glucometer Elite Classic Kit (Blood glucose monitoring suppl) .... Dispense glucometer to check blood sugar daily dx 250.00 6)  Glucometer Elite Test Strp (Glucose blood) .... Use to test blood sugar once daily before breakfast dx 250.00 7)  Norvasc 5 Mg Tabs (Amlodipine besylate) .... Take one tablet by mouth daily for blood pressure 8)  Viagra 100 Mg Tabs (Sildenafil citrate) .... One tablet by mouth 30 minutes  before sexual activity 9)  Voltaren 1 % Gel (Diclofenac sodium) .... Apply 4gm to left hip topically two times a day as needed  Diabetes Management Assessment/Plan:      His blood pressure goal is < 130/80.    Hypertension Assessment/Plan:      The patient's hypertensive risk group is category C: Target organ damage and/or diabetes.  His calculated 10 year risk of coronary heart disease is 11 %.  Today's blood pressure is  122/92.  His blood pressure goal is < 130/80.  Patient Instructions: 1)  You most likely had a kidney stone. 2)  Read handout 3)  Your labs will be checked and you will be notified of the time/date of the appointment. 4)  Keep next appointment as previously scheduled  Laboratory Results   Urine Tests  Date/Time Received: June 26, 2010 8:50 AM   Routine Urinalysis   Color: yellow Appearance: Clear Glucose: negative   (Normal Range: Negative) Bilirubin: negative   (Normal Range: Negative) Ketone: negative   (Normal Range: Negative) Spec. Gravity: 1.025   (Normal Range: 1.003-1.035) Blood: trace-intact   (Normal Range: Negative) pH: 6.0   (Normal Range: 5.0-8.0) Protein: trace   (Normal Range: Negative) Urobilinogen: 0.2   (Normal Range: 0-1) Nitrite: negative   (  Normal Range: Negative) Leukocyte Esterace: trace   (Normal Range: Negative)     Blood Tests     CBG Random:: 161      Last LDL:                                                 122 (01/11/2010 3:29:00 AM)        Diabetic Foot Exam    10-g (5.07) Semmes-Weinstein Monofilament Test Performed by: Levon Hedger          Right Foot          Left Foot Visual Inspection               Test Control      normal         normal Site 1         normal         normal Site 2         normal         normal Site 3         normal         normal Site 4         normal         normal Site 5         normal         normal Site 6         normal         normal Site 7         normal         normal Site 8         normal         normal Site 9         normal         normal Site 10         normal         normal  Impression      normal         normal

## 2010-11-28 NOTE — Assessment & Plan Note (Signed)
Summary: Left hip pain   Vital Signs:  Patient profile:   45 year old male Weight:      306.1 pounds BMI:     51.12 Temp:     98.2 degrees F oral Pulse rate:   82 / minute Pulse rhythm:   regular Resp:     20 per minute BP sitting:   126 / 82  (left arm) Cuff size:   large  Vitals Entered By: Levon Hedger (June 07, 2010 9:30 AM)  Nutrition Counseling: Patient's BMI is greater than 25 and therefore counseled on weight management options. CC: hip pain feels like a toothache...very irritating, Hypertension Management Is Patient Diabetic? Yes Pain Assessment Patient in pain? yes     Location: hip  Onset of pain  Chronic CBG Result 104 CBG Device ID B  Does patient need assistance? Functional Status Self care Ambulation Normal   CC:  hip pain feels like a toothache...very irritating and Hypertension Management.  History of Present Illness:  Pt into the office with complaints of left hip pain. Pt describes that pain in not actually in the hip joint it is rather the skin. Skin is burning and is limited in the left hip but not radiation down the leg. Pt is s/p left hip surgery several months ago and he has been back to [facility] for appropriate f/u. Pt is trying not to lay on the left side at night as he is aware that will increase irritation. Pt has used some rubbing alcohol to the affected area without much relief.  He has not used any other otc meds. Pain is intermittent and not constant  Hypertension History:      He denies headache, chest pain, and palpitations.  He notes no problems with any antihypertensive medication side effects.  Pt is taking meds as ordered.        Positive major cardiovascular risk factors include diabetes and hypertension.  Negative major cardiovascular risk factors include male age less than 65 years old and non-tobacco-user status.        Further assessment for target organ damage reveals no history of ASHD, cardiac end-organ damage  (CHF/LVH), stroke/TIA, peripheral vascular disease, renal insufficiency, or hypertensive retinopathy.     Allergies (verified): No Known Drug Allergies  Review of Systems General:  Denies fever. CV:  Denies chest pain or discomfort. Resp:  Denies cough. GI:  Denies abdominal pain, nausea, and vomiting. Neuro:  Complains of numbness and tingling; left hip.  Physical Exam  General:  alert.  obese Head:  normocephalic.   Lungs:  normal breath sounds.   Heart:  normal rate and regular rhythm.   Neurologic:  alert & oriented X3.   Psych:  Oriented X3.     Impression & Recommendations:  Problem # 1:  HIP PAIN, LEFT (ICD-719.45) will start voltaren gel to affected area Orders: Capillary Blood Glucose/CBG (09811)  Problem # 2:  HYPERTENSION, BENIGN ESSENTIAL (ICD-401.1) BP is doing well. Pt to continue current meds His updated medication list for this problem includes:    Metoprolol Tartrate 100 Mg Tabs (Metoprolol tartrate) ..... One tablet by mouth two times a day for blood pressure    Lisinopril-hydrochlorothiazide 20-25 Mg Tabs (Lisinopril-hydrochlorothiazide) ..... One tablet by mouth daily for blood pressure    Norvasc 5 Mg Tabs (Amlodipine besylate) .Marland Kitchen... Take one tablet by mouth daily for blood pressure  Problem # 3:  DIABETES MELLITUS (ICD-250.00)  His updated medication list for this problem includes:  Lisinopril-hydrochlorothiazide 20-25 Mg Tabs (Lisinopril-hydrochlorothiazide) ..... One tablet by mouth daily for blood pressure    Glucophage Xr 500 Mg Xr24h-tab (Metformin hcl) .Marland Kitchen... 1 tablet by mouth daily for blood sugar  Orders: Capillary Blood Glucose/CBG (30865)  Complete Medication List: 1)  Metoprolol Tartrate 100 Mg Tabs (Metoprolol tartrate) .... One tablet by mouth two times a day for blood pressure 2)  Omeprazole 20 Mg Cpdr (Omeprazole) .... One tablet by mouth two times a day before meals 3)  Lisinopril-hydrochlorothiazide 20-25 Mg Tabs  (Lisinopril-hydrochlorothiazide) .... One tablet by mouth daily for blood pressure 4)  Glucophage Xr 500 Mg Xr24h-tab (Metformin hcl) .Marland Kitchen.. 1 tablet by mouth daily for blood sugar 5)  Glucometer Elite Classic Kit (Blood glucose monitoring suppl) .... Dispense glucometer to check blood sugar daily dx 250.00 6)  Glucometer Elite Test Strp (Glucose blood) .... Use to test blood sugar once daily before breakfast dx 250.00 7)  Norvasc 5 Mg Tabs (Amlodipine besylate) .... Take one tablet by mouth daily for blood pressure 8)  Viagra 100 Mg Tabs (Sildenafil citrate) .... One tablet by mouth 30 minutes  before sexual activity 9)  Voltaren 1 % Gel (Diclofenac sodium) .... Apply 4gm to left hip topically two times a day as needed  Hypertension Assessment/Plan:      The patient's hypertensive risk group is category C: Target organ damage and/or diabetes.  His calculated 10 year risk of coronary heart disease is 7 %.  Today's blood pressure is 126/82.  His blood pressure goal is < 130/80.  Patient Instructions: 1)  Continue your current medications 2)  Use ointment to left hip to see if it provides any relief 3)  Follow up as needed Prescriptions: VOLTAREN 1 % GEL (DICLOFENAC SODIUM) Apply 4gm to left hip topically two times a day as needed  #100gm x 0   Entered and Authorized by:   Lehman Prom FNP   Signed by:   Lehman Prom FNP on 06/07/2010   Method used:   Print then Give to Patient   RxID:   [id number]

## 2010-11-28 NOTE — Assessment & Plan Note (Signed)
Summary: HTN/Diabetes   Vital Signs:  Patient profile:   45 year old male Weight:      307.2 pounds Temp:     97.5 degrees F oral Pulse rate:   72 / minute Pulse rhythm:   regular Resp:     16 per minute BP sitting:   168 / 110  (left arm) Cuff size:   regular  Vitals Entered By: Levon Hedger (August 01, 2010 11:34 AM) CC: follow-up visit, Hypertension Management, Headache Is Patient Diabetic? Yes Pain Assessment Patient in pain? yes     Location: hip Intensity: 10 CBG Result 96 CBG Device ID A  Does patient need assistance? Functional Status Self care Ambulation Normal   CC:  follow-up visit, Hypertension Management, and Headache.  History of Present Illness:  Pt into the office for f/u for left hip pain and headache  Headache HPI:      The location of the headaches are bilateral.  Headache quality is pressure or tightness.         Hypertension History:      He complains of headache, but denies chest pain and palpitations.  pt has been taking bp meds as ordered.  Marland Kitchen        Positive major cardiovascular risk factors include diabetes and hypertension.  Negative major cardiovascular risk factors include male age less than 63 years old and non-tobacco-user status.        Further assessment for target organ damage reveals no history of ASHD, cardiac end-organ damage (CHF/LVH), stroke/TIA, peripheral vascular disease, renal insufficiency, or hypertensive retinopathy.       Allergies (verified): No Known Drug Allergies  Review of Systems General:  Complains of weakness. ENT:  Complains of hoarseness and sore throat. CV:  Denies chest pain or discomfort. Resp:  Denies cough. GI:  Denies abdominal pain, nausea, and vomiting. MS:  Complains of joint pain; left hip pain - using cane  left knee pain - using knee brace. Neuro:  Complains of headaches and weakness; left sided.  Physical Exam  General:  alert.  obese Head:  normocephalic.   Eyes:  glasses Mouth:   soft voice Lungs:  normal breath sounds.   Heart:  normal rate and regular rhythm.   Abdomen:  normal bowel sounds.   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 if due to pain  His updated medication list for this problem includes:    Metoprolol Tartrate 100 Mg Tabs (Metoprolol tartrate) ..... One tablet by mouth two times a day for blood pressure    Lisinopril-hydrochlorothiazide 20-25 Mg Tabs (Lisinopril-hydrochlorothiazide) ..... One tablet by mouth daily for blood pressure    Norvasc 5 Mg Tabs (Amlodipine besylate) .Marland Kitchen... Take one tablet by mouth daily for blood pressure  Problem # 4:  DIABETES MELLITUS (ICD-250.00)  His updated medication list for this problem includes:    Lisinopril-hydrochlorothiazide 20-25 Mg Tabs (Lisinopril-hydrochlorothiazide) ..... One  tablet by mouth daily for blood pressure    Glucophage Xr 500 Mg Xr24h-tab (Metformin hcl) .Marland Kitchen... 1 tablet by mouth daily for blood sugar  Orders: Capillary Blood Glucose/CBG (84166)  Problem # 5:  NEED PROPHYLACTIC VACCINATION&INOCULATION FLU (ICD-V04.81) flu vaccine given today  Problem # 6:  HIP PAIN, LEFT (ICD-719.45) Assessment: Unchanged will start pain meds as needed  pt to keep appt with ortho instructions for cane use given to pt His updated medication list for this problem includes:    Vicodin 5-500 Mg Tabs (Hydrocodone-acetaminophen) ..... One tablet by mouth daily as needed pain  Complete  Medication List: 1)  Metoprolol Tartrate 100 Mg Tabs (Metoprolol tartrate) .... One tablet by mouth two times a day for blood pressure 2)  Omeprazole 20 Mg Cpdr (Omeprazole) .... One tablet by mouth two times a day before meals 3)  Lisinopril-hydrochlorothiazide 20-25 Mg Tabs (Lisinopril-hydrochlorothiazide) .... One tablet by mouth daily for blood pressure 4)  Glucophage Xr 500 Mg Xr24h-tab (Metformin hcl) .Marland Kitchen.. 1 tablet by mouth daily for blood sugar 5)  Glucometer Elite Classic Kit (Blood glucose monitoring suppl) .... Dispense glucometer to check blood sugar daily dx 250.00 6)  Glucometer Elite Test Strp (Glucose blood) .... Use to test blood sugar once daily before breakfast dx 250.00 7)  Norvasc 5 Mg Tabs (Amlodipine besylate) .... Take one tablet by mouth daily for blood pressure 8)  Viagra 100 Mg Tabs (Sildenafil citrate) .... One tablet by mouth 30 minutes  before sexual activity 9)  Voltaren 1 % Gel (Diclofenac sodium) .... Apply 4gm to left hip topically two times a day as needed 10)  Topamax 100 Mg Tabs (Topiramate) .... 2 tablets by mouth daily for headaches 11)  Nortriptyline Hcl 10 Mg Caps (Nortriptyline hcl) .... 3 capsules by mouth nightly 12)  Vicodin 5-500 Mg Tabs (Hydrocodone-acetaminophen) .... One tablet by mouth daily as needed pain  Other Orders: Flu Vaccine 6yrs + (06301) Admin 1st Vaccine (60109) Admin 1st Vaccine [facility]) [phone])  Hypertension Assessment/Plan:      The patient's hypertensive risk group is category C: Target organ damage and/or diabetes.  His calculated 10 year risk of coronary heart disease is 14 %.  Today's blood pressure is 168/110.  His blood pressure goal is < 130/80.  Patient Instructions: 1)  You have been given a flu vaccine today. 2)  Take topamax at 10PM  3)  Take nortriptyline at 11pm  4)  or at least 1 hour apart from each other 5)  Keep appointment with [facility] as received 6)  Keep appointment with Dr. Jan Fireman at [facility] on August 21, 2010. 7)  Follow up in 2 weeks with theresa - triage nurse for blood pressure check.  Take your blood pressure medications before this visit.  Goal 140/90 8)  Follow up with n.martin,fnp in 8 weeks for diabetes and high blood pressure. 9)  Use your cane as needed for ambulation Prescriptions: VICODIN 5-500 MG TABS (HYDROCODONE-ACETAMINOPHEN) One tablet by mouth daily as needed pain  #30 x 0   Entered and Authorized by:   Lehman Prom FNP   Signed by:   Lehman Prom FNP on 08/01/2010   Method used:   Print then Give to Patient   RxID:   [id number]    Influenza Vaccine    Vaccine Type: Fluvax 3+    Site: right deltoid    Mfr: GlaxoSmithKline    Dose: 0.5 ml    Route: IM    Given by: Levon Hedger  Exp. Date: 03/2011    Lot #: [id number]    VIS given: 05/23/10 version given August 01, 2010.  Flu Vaccine Consent Questions    Do you have a history of severe allergic reactions to this vaccine? no    Any prior history of allergic reactions to egg and/or gelatin? no    Do you have a sensitivity to the preservative Thimersol? no    Do you have a past history of Guillan-Barre Syndrome? no    Do you currently have an acute febrile illness? no    Have you ever had a severe reaction to latex? no    Vaccine information given and explained to patient? yes    ndc  [phone]

## 2010-11-28 NOTE — Letter (Signed)
Summary: [facility]   Imported By: Arta Bruce 08/01/2010 12:35:58  _____________________________________________________________________  External Attachment:    Type:   Image     Comment:   External Document

## 2010-11-28 NOTE — Assessment & Plan Note (Signed)
Summary: Diabetes/HTN   Vital Signs:  Patient profile:   45 year old male Height:      65 inches Weight:      310 pounds BMI:     51.77 Temp:     98.1 degrees F oral Pulse rate:   67 / minute Pulse rhythm:   regular Resp:     18 per minute BP sitting:   131 / 85  (left arm) Cuff size:   large  Vitals Entered By: Arm[place] Shannon (January 10, 2010 8:34 AM) CC: three month...., Hypertension Management Is Patient Diabetic? Yes Pain Assessment Patient in pain? no      CBG Result 101  Does patient need assistance? Functional Status Self care Ambulation Normal   CC:  three month.... and Hypertension Management.  History of Present Illness:  Pt into the office for 3 month f/u - diabetes/HTN  Left eye - previous history of chalazion to left eye - removed 2 years at [facility]. Symptoms restarted 1 week ago - Currently with some redness in his left eye. +tearing +itching No blurring of vision Last eye exam was 2 years ago at [facility].  Diabetes Management History:      The patient is a 45 years old male who comes in for evaluation of Type 2 Diabetes Mellitus.  He has not been enrolled in the "Diabetic Education Program".  He states understanding of dietary principles and is following his diet appropriately.  No sensory loss is reported.  Self foot exams are not being performed.  He is not checking home blood sugars.  He says that he is not exercising regularly.        Hypoglycemic symptoms are not occurring.  No hyperglycemic symptoms are reported.        No changes have been made to his treatment plan since last visit.    Hypertension History:      He denies headache, chest pain, and palpitations.  He notes no problems with any antihypertensive medication side effects.  Bp is stable.  He is taking medications as ordered.        Positive major cardiovascular risk factors include diabetes and hypertension.  Negative major cardiovascular risk factors include male age less than 45 years  old and non-tobacco-user status.        Further assessment for target organ damage reveals no history of ASHD, cardiac end-organ damage (CHF/LVH), stroke/TIA, peripheral vascular disease, renal insufficiency, or hypertensive retinopathy.     Habits & Providers  Alcohol-Tobacco-Diet     Alcohol drinks/day: 0     Tobacco Status: never  Exercise-Depression-Behavior     Does Patient Exercise: yes     Exercise Counseling: not indicated; exercise is adequate     Depression Counseling: further diagnostic testing and/or other treatment is indicated     Drug Use: never     Seat Belt Use: 100     Sun Exposure: occasionally  Current Medications (verified): 1)  Metoprolol Tartrate 100 Mg Tabs (Metoprolol Tartrate) .... One Tablet By Mouth Two Times A Day For Blood Pressure 2)  Omeprazole 20 Mg Cpdr (Omeprazole) .... One Tablet By Mouth Two Times A Day Before Meals 3)  Percocet 5-325 Mg  Tabs (Oxycodone-Acetaminophen) .Marland Kitchen.. 1 Tablet By Mouth Every 6 Hours As Needed For Pain 4)  Topamax 100 Mg  Tabs (Topiramate) .... 2 Tablets By Mouth Daily For Headache 5)  Topamax 25 Mg  Tabs (Topiramate) .Marland Kitchen.. 1 Tablet By Mouth Daily For Headache **take in Combination With  200mg  Daily** 6)  Lisinopril-Hydrochlorothiazide 20-25 Mg Tabs (Lisinopril-Hydrochlorothiazide) .... One Tablet By Mouth Daily For Blood Pressure 7)  Glucophage Xr 500 Mg Xr24h-Tab (Metformin Hcl) .Marland Kitchen.. 1 Tablet By Mouth Daily For Blood Sugar 8)  Glucometer Elite Classic  Kit (Blood Glucose Monitoring Suppl) .... Dispense Glucometer To Check Blood Sugar Daily Dx 250.00 9)  Glucometer Elite Test  Strp (Glucose Blood) .... Use To Test Blood Sugar Once Daily Before Breakfast Dx 250.00 10)  Norvasc 5 Mg Tabs (Amlodipine Besylate) .... Take One Tablet By Mouth Daily For Blood Pressure  Allergies (verified): No Known Drug Allergies  Social History: Does Patient Exercise:  yes  Review of Systems General:  Denies loss of appetite. CV:  Denies chest  pain or discomfort. Resp:  Denies cough. GI:  Denies abdominal pain, nausea, and vomiting. MS:  Complains of joint pain; especially when walking on the treadmill. Neuro:  Complains of numbness and tingling; left hip.  Physical Exam  General:  alert.  obese Head:  normocephalic.   Eyes:  left eye - injected conjunctiva pupil round and reactive Mouth:  fair dentition.   Lungs:  normal breath sounds.   Heart:  normal rate and regular rhythm.   Abdomen:  obese Msk:  normal ROM.   Neurologic:  alert & oriented X3 and gait normal.   Skin:  color normal.   Psych:  Oriented X3.    Diabetes Management Exam:    Foot Exam (with socks and/or shoes not present):       Sensory-Monofilament:          Left foot: normal          Right foot: normal   Impression & Recommendations:  Problem # 1:  DIABETES MELLITUS (ICD-250.00) will check Hgba1c continue current meds His updated medication list for this problem includes:    Lisinopril-hydrochlorothiazide 20-25 Mg Tabs (Lisinopril-hydrochlorothiazide) ..... One tablet by mouth daily for blood pressure    Glucophage Xr 500 Mg Xr24h-tab (Metformin hcl) .Marland Kitchen... 1 tablet by mouth daily for blood sugar  Orders: Capillary Blood Glucose/CBG 850 [id number]) T- Hemoglobin A1C (66440-34742) T-Lipid Profile (59563-87564) T-CBC w/Diff (33295-18841) T-TSH (66063-01601)  Problem # 2:  HYPERTENSION, BENIGN ESSENTIAL (ICD-401.1) stable DASH diet continue current meds His updated medication list for this problem includes:    Metoprolol Tartrate 100 Mg Tabs (Metoprolol tartrate) ..... One tablet by mouth two times a day for blood pressure    Lisinopril-hydrochlorothiazide 20-25 Mg Tabs (Lisinopril-hydrochlorothiazide) ..... One tablet by mouth daily for blood pressure    Norvasc 5 Mg Tabs (Amlodipine besylate) .Marland Kitchen... Take one tablet by mouth daily for blood pressure  Problem # 3:  HIP PAIN, LEFT (ICD-719.45) will order neurontin as pt is not able to afford the  lidoderm patch prescribed by specialist His updated medication list for this problem includes:    Percocet 5-325 Mg Tabs (Oxycodone-acetaminophen) .Marland Kitchen... 1 tablet by mouth every 6 hours as needed for pain  Problem # 4:  OTHER EYE PROBLEMS (ICD-V41.1) will refer to optho - he needs yearly exams anyway given diabetes Orders: Ophthalmology Referral (Ophthalmology)  Complete Medication List: 1)  Metoprolol Tartrate 100 Mg Tabs (Metoprolol tartrate) .... One tablet by mouth two times a day for blood pressure 2)  Omeprazole 20 Mg Cpdr (Omeprazole) .... One tablet by mouth two times a day before meals 3)  Percocet 5-325 Mg Tabs (Oxycodone-acetaminophen) .Marland Kitchen.. 1 tablet by mouth every 6 hours as needed for pain 4)  Topamax 100 Mg Tabs (Topiramate) .... 2 tablets  by mouth daily for headache 5)  Topamax 25 Mg Tabs (Topiramate) .Marland Kitchen.. 1 tablet by mouth daily for headache **take in combination with 200mg  daily** 6)  Lisinopril-hydrochlorothiazide 20-25 Mg Tabs (Lisinopril-hydrochlorothiazide) .... One tablet by mouth daily for blood pressure 7)  Glucophage Xr 500 Mg Xr24h-tab (Metformin hcl) .Marland Kitchen.. 1 tablet by mouth daily for blood sugar 8)  Glucometer Elite Classic Kit (Blood glucose monitoring suppl) .... Dispense glucometer to check blood sugar daily dx 250.00 9)  Glucometer Elite Test Strp (Glucose blood) .... Use to test blood sugar once daily before breakfast dx 250.00 10)  Norvasc 5 Mg Tabs (Amlodipine besylate) .... Take one tablet by mouth daily for blood pressure 11)  Gabapentin 300 Mg Caps (Gabapentin) .... One capsule by mouth nightly x 1 week then increase to 2 capsules by mouth nightly for hip pain  Other Orders: Rapid HIV  (35573)  Diabetes Management Assessment/Plan:      His blood pressure goal is < 130/80.    Hypertension Assessment/Plan:      The patient's hypertensive risk group is category C: Target organ damage and/or diabetes.  His calculated 10 year risk of coronary heart disease is  6 %.  Today's blood pressure is 131/85.  His blood pressure goal is < 130/80.  Patient Instructions: 1)  Continue current medications.   2)  Blood pressure and Blood sugar are ok. 3)  You will be notified of any abnormal lab results. 4)  Left hip - may be due to nerve involvement.  Try gabapentin 300mg  by mouth nightly  1 week then increase to 2 capsules by mouth nightly  5)  You will be referred toDr. Mitzi Davenport for an eye exam and notified of the time/date of the appointment 6)  Follow up in 3 months for diabetes and blood pressure. Prescriptions: GABAPENTIN 300 MG CAPS (GABAPENTIN) One capsule by mouth nightly x 1 week then increase to 2 capsules by mouth nightly for hip pain  #180 x 0   Entered and Authorized by:   Lehman Prom FNP   Signed by:   Lehman Prom FNP on 01/10/2010   Method used:   Print then Give to Patient   RxID:   [id number]   Last LDL:                                                 97 (12/02/2009 9:31:00 PM)        Diabetic Foot Exam    10-g (5.07) Semmes-Weinstein Monofilament Test Performed by: Armenia Shannon          Right Foot          Left Foot Visual Inspection               Test Control      normal         normal Site 1         normal         normal Site 2         normal         normal Site 3         normal         normal Site 4         normal         normal Site 5  normal         normal Site 6         normal         normal Site 7         normal         normal Site 8         normal         normal Site 9         normal         normal Site 10         normal         normal  Impression      normal         normal  Laboratory Results   Blood Tests     CBG Random:: 101  Date/Time Received: January 10, 2010 9:31 AM  Date/Time Reported: January 10, 2010 9:31 AM   Other Tests  Rapid HIV: negative   Prevention & Chronic Care Immunizations   Influenza vaccine: Fluvax 3+  (10/12/2009)    Tetanus booster: 10/29/2006:  Historical    Pneumococcal vaccine: Pneumovax  (06/24/2009)  Other Screening   Smoking status: never  (01/10/2010)  Diabetes Mellitus   HgbA1C: 6.2  (10/12/2009)    Eye exam: Not documented   Diabetic eye exam action/deferral: Ophthalmology referral  (01/10/2010)    Foot exam: yes  (01/10/2010)   High risk foot: Not documented   Foot care education: Not documented    Urine microalbumin/creatinine ratio: Not documented  Lipids   Total Cholesterol: 162  (12/02/2009)   LDL: 97  (12/02/2009)   LDL Direct: Not documented   HDL: 41  (12/02/2009)   Triglycerides: 119  (12/02/2009)  Hypertension   Last Blood Pressure: 131 / 85  (01/10/2010)   Serum creatinine: 0.81  (08/15/2009)   Serum potassium 4.5  (08/15/2009)  Self-Management Support :    Diabetes self-management support: Not documented    Hypertension self-management support: Not documented

## 2010-11-28 NOTE — Assessment & Plan Note (Signed)
Summary: Diabetes/HTN   Vital Signs:  Patient profile:   45 year old male Weight:      307.31 pounds Temp:     98.3 degrees F oral Pulse rate:   72 / minute Pulse rhythm:   regular Resp:     18 per minute BP sitting:   146 / 98  (left arm) Cuff size:   regular  Vitals Entered By: Hale Drone CMA (September 27, 2010 12:04 PM) CC: 8 week f/u on BP and DM. , Hypertension Management Is Patient Diabetic? Yes Pain Assessment Patient in pain? yes     Location: hip Intensity: 7/8 Type: sharp Onset of pain  Intermittent CBG Result 121 CBG Device ID A Non fasting  Does patient need assistance? Functional Status Self care Ambulation Normal   CC:  8 week f/u on BP and DM.  and Hypertension Management.  History of Present Illness:  Pt into the office for f/u.  Vocal cords - pt has been to [facility] for further evaluation.  He has seen Dr. Ladon Applebaum and is also going to physical therapy (linda).  He goes to [facility] for speech twice per week.  Left hip - Pt has been back to [facility] for f/u. Pt had MRI of lumbar spine to evaluate lumbar radiculopathy. Pt had the test done on 08/21/2010. He was dx with hip osteoarthritis and left hip trochanteric bursitis He will start physical therapy on next week.   Diabetes Management History:      The patient is a 45 years old male who comes in for evaluation of Type 2 Diabetes Mellitus.  He has not been enrolled in the "Diabetic Education Program".  He states understanding of dietary principles and is following his diet appropriately.  No sensory loss is reported.  He is not checking home blood sugars.  He says that he is exercising.        Other comments include: Pt refused foot exam today.    Hypertension History:      He denies headache, chest pain, and palpitations.  He notes no problems with any antihypertensive medication side effects.        Positive major cardiovascular risk factors include diabetes and hypertension.  Negative major  cardiovascular risk factors include male age less than 71 years old and non-tobacco-user status.        Further assessment for target organ damage reveals no history of ASHD, cardiac end-organ damage (CHF/LVH), stroke/TIA, peripheral vascular disease, renal insufficiency, or hypertensive retinopathy.    Current Medications (verified): 1)  Metoprolol Tartrate 100 Mg Tabs (Metoprolol Tartrate) .... One Tablet By Mouth Two Times A Day For Blood Pressure 2)  Omeprazole 20 Mg Cpdr (Omeprazole) .... One Tablet By Mouth Two Times A Day Before Meals 3)  Lisinopril-Hydrochlorothiazide 20-25 Mg Tabs (Lisinopril-Hydrochlorothiazide) .... One Tablet By Mouth Daily For Blood Pressure 4)  Glucophage Xr 500 Mg Xr24h-Tab (Metformin Hcl) .Marland Kitchen.. 1 Tablet By Mouth Daily For Blood Sugar 5)  Glucometer Elite Classic  Kit (Blood Glucose Monitoring Suppl) .... Dispense Glucometer To Check Blood Sugar Daily Dx 250.00 6)  Glucometer Elite Test  Strp (Glucose Blood) .... Use To Test Blood Sugar Once Daily Before Breakfast Dx 250.00 7)  Norvasc 5 Mg Tabs (Amlodipine Besylate) .... Take One Tablet By Mouth Daily For Blood Pressure 8)  Viagra 100 Mg Tabs (Sildenafil Citrate) .... One Tablet By Mouth 30 Minutes  Before Sexual Activity 9)  Voltaren 1 % Gel (Diclofenac Sodium) .... Apply 4gm To Left Hip  Topically Two Times A Day As Needed 10)  Topamax 100 Mg Tabs (Topiramate) .... 2 Tablets By Mouth Daily For Headaches 11)  Nortriptyline Hcl 10 Mg Caps (Nortriptyline Hcl) .... 3 Capsules By Mouth Nightly 12)  Vicodin 5-500 Mg Tabs (Hydrocodone-Acetaminophen) .... One Tablet By Mouth Daily As Needed Pain  Allergies (verified): No Known Drug Allergies  Review of Systems General:  Denies fever. CV:  Denies chest pain or discomfort. Resp:  Denies cough. GI:  Denies abdominal pain, nausea, and vomiting. MS:  Complains of joint pain and stiffness; +left hip.  Physical Exam  General:  alert.  obese Head:  normocephalic.     Abdomen:  normal bowel sounds.   Neurologic:  alert & oriented X3.   Skin:  color normal.   Psych:  cane   Impression & Recommendations:  Problem # 1:  CHRONIC TENSION TYPE HEADACHE (ICD-339.12) Pt has restarted topamax as ordered headaches are doing some better  Problem # 2:  OBESITY (ICD-278.00) ongoing battle with weight  Problem # 3:  HIP PAIN, LEFT (ICD-719.45) pt to start physical therapy as ordered His updated medication list for this problem includes:    Vicodin 5-500 Mg Tabs (Hydrocodone-acetaminophen) ..... 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 One tablet by mouth two times a day for blood pressure 2)  Omeprazole 20 Mg Cpdr (Omeprazole) .... One tablet by mouth two times a day before meals 3)  Lisinopril-hydrochlorothiazide 20-25 Mg Tabs (Lisinopril-hydrochlorothiazide) .... One tablet by mouth daily for blood pressure 4)  Glucophage Xr 500 Mg Xr24h-tab (Metformin hcl) .Marland Kitchen.. 1 tablet by mouth daily for blood sugar 5)  Glucometer Elite Classic Kit (Blood glucose monitoring suppl) .... Dispense glucometer to check blood sugar daily dx 250.00 6)  Glucometer Elite Test Strp (Glucose blood) .... Use to test blood sugar once daily before breakfast dx 250.00 7)  Norvasc 10 Mg Tabs (Amlodipine besylate) .... One tablet by mouth daily  **note change in dose** 8)  Viagra 100 Mg Tabs (Sildenafil citrate) .... One tablet by mouth 30 minutes  before sexual activity 9)  Voltaren 1 % Gel (Diclofenac sodium) .... Apply 4gm to left hip topically two times a day as needed 10)  Topamax 100 Mg Tabs (Topiramate) .... 2 tablets by mouth daily for headaches 11)  Nortriptyline Hcl 10 Mg Caps (Nortriptyline hcl) .... 3 capsules by mouth nightly 12)  Vicodin 5-500  Mg Tabs (Hydrocodone-acetaminophen) .... One tablet by mouth daily as needed pain  Other Orders: Capillary Blood Glucose/CBG (37628)  Diabetes Management Assessment/Plan:      His blood pressure goal is < 130/80.    Hypertension Assessment/Plan:      The patient's hypertensive risk group is category C: Target organ damage and/or diabetes.  His calculated 10 year risk of coronary heart disease is 11 %.  Today's blood pressure is 146/98.  His blood pressure goal is < 130/80.   Patient Instructions: 1)  Blood pressure - Will increase Norvasc to 10mg  by mouth daily 2)  You can take 2 of the Norvasc 5mg  by mouth daily since you already have a 90 day supply 3)  Diabetes - Keep taking your diabetes medications as ordered 4)  Keep your appointments with speech therapy  5)  Start your physical therapy as ordered on next week 6)  Follow up in 2 months for diabetes or sooner if necessary   Orders Added: 1)  Capillary Blood Glucose/CBG [82948] 2)  Est. Patient Level III [47829]

## 2010-11-28 NOTE — Assessment & Plan Note (Signed)
Summary: Studdering/Weakness   Vital Signs:  Patient profile:   45 year old male Height:      65 inches Weight:      306.6 pounds BMI:     51.21 Temp:     98.6 degrees F oral Pulse rate:   76 / minute Pulse rhythm:   regular Resp:     18 per minute BP sitting:   130 / 82  (left arm) Cuff size:   large  Vitals Entered By: Armenia Shannon (July 13, 2010 8:28 AM)  Nutrition Counseling: Patient's BMI is greater than 25 and therefore counseled on weight management options. CC: f/u on dm....pt says his head is hurting and he is studdering now... pt says there is a med he used to take and should have been taking all a long.. Is Patient Diabetic? Yes Pain Assessment Patient in pain? no       Does patient need assistance? Functional Status Self care Ambulation Normal   CC:  f/u on dm....pt says his head is hurting and he is studdering now... pt says there is a med he used to take and should have been taking all a long...  History of Present Illness:  Pt into the office for f/u however he was seen 3 weeks ago.  Doing relatively well at that time except for some back pain which was dx with nephrolithiasis. Pt also has some left hip pain which has been ongoing (s/p procedure by Ortho in [facility]) Headaches have been increasing for the past 3 months. Pt was previously taking topamax but stopped in May 2011.    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 MATTHEW EWING no current neurologist  Family has noticed that pt has not been feeling well.  Mother is driving for pt today and has been for the pst 2 weeks.  Prior to two weeks ago pt drives and functions normally. Mother had to help dress the pt today. Shoes are untied as he has is not able to bend down and tie his  shoes.  Current Medications (verified): 1)  Metoprolol Tartrate 100 Mg Tabs (Metoprolol Tartrate) .... One Tablet By Mouth Two Times A Day For Blood Pressure 2)  Omeprazole 20 Mg Cpdr (Omeprazole) .... One Tablet By Mouth Two Times A Day Before Meals 3)  Lisinopril-Hydrochlorothiazide 20-25 Mg Tabs (Lisinopril-Hydrochlorothiazide) .... One Tablet By Mouth Daily For Blood Pressure 4)  Glucophage Xr 500 Mg Xr24h-Tab (Metformin Hcl) .Marland Kitchen.. 1 Tablet By Mouth Daily For Blood Sugar 5)  Glucometer Elite Classic  Kit (Blood Glucose Monitoring Suppl) .... Dispense Glucometer To Check Blood Sugar Daily Dx 250.00 6)  Glucometer Elite Test  Strp (Glucose Blood) .... Use To Test Blood Sugar Once Daily Before Breakfast Dx 250.00 7)  Norvasc 5 Mg Tabs (Amlodipine Besylate) .... Take One Tablet By Mouth Daily For Blood Pressure 8)  Viagra 100 Mg Tabs (Sildenafil Citrate) .... One Tablet By Mouth 30 Minutes  Before Sexual Activity 9)  Voltaren 1 % Gel (Diclofenac Sodium) .... Apply 4gm To Left Hip Topically Two Times A Day As Needed  Allergies (verified): No Known Drug Allergies  Past History:  Past Surgical History: Carpal tunnel release - Dr. Janan Halter, [facility] [place] Rotator cuff repair arthroscopic surgery left knee - Dr. 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 Molli Hazard [place] vocal cord surgery, implant left vocal cord, Dr. Lawrence Marseilles 03/26/07 [facility]  Review of Systems General:  Denies fever. CV:  Denies chest pain or discomfort. Resp:  Denies cough. GI:  Denies abdominal pain, nausea, and vomiting. Neuro:  Complains of headaches, poor balance, visual disturbances, and weakness; studdering.  Physical Exam  General:  alert.   Head:  normocephalic.   Eyes:  glasses Neurologic:  alert Skin:  color normal.   Psych:  Oriented X3.    Diabetes Management Exam:    Foot Exam (with socks and/or shoes not present):        Sensory-Monofilament:          Left foot: normal          Right foot: normal   Detailed Neurologic Exam  Speech:    studdering Cranial Nerves:    pupils equal and reactive to light.   Posture:    not quite erect.   Strength:    abnormal left upper extremity.     Impression & Recommendations:  Problem # 1:  STUTTERING (ICD-307.0) NEW ONSET - CONCERNING FOR PT   Problem # 2:  WEAKNESS (ICD-780.79)  Problem # 3:  CHRONIC TENSION TYPE HEADACHE (ICD-339.12)  Problem # 4:  HYPERTENSION, BENIGN ESSENTIAL (ICD-401.1)  His updated medication list for this problem includes:    Metoprolol Tartrate 100 Mg Tabs (Metoprolol tartrate) ..... One tablet by mouth two times a day for blood pressure    Lisinopril-hydrochlorothiazide 20-25 Mg Tabs (Lisinopril-hydrochlorothiazide) ..... One tablet by mouth daily for blood pressure    Norvasc 5 Mg Tabs (Amlodipine besylate) .Marland Kitchen... Take one tablet by mouth daily for blood pressure  Problem # 5:  DIABETES MELLITUS (ICD-250.00)  His updated medication list for this problem includes:    Lisinopril-hydrochlorothiazide 20-25 Mg Tabs (Lisinopril-hydrochlorothiazide) ..... One tablet by mouth daily for blood pressure    Glucophage Xr 500 Mg Xr24h-tab (Metformin hcl) .Marland Kitchen... 1 tablet by mouth daily for blood sugar  Orders: Hemoglobin A1C (83036)  Complete Medication List: 1)  Metoprolol Tartrate 100 Mg Tabs (Metoprolol tartrate) .... One tablet by mouth two times a day for blood pressure 2)  Omeprazole 20 Mg Cpdr (Omeprazole) .... One tablet by mouth two times a day before meals 3)  Lisinopril-hydrochlorothiazide 20-25 Mg Tabs (Lisinopril-hydrochlorothiazide) .... One tablet by mouth daily for blood pressure 4)  Glucophage Xr 500 Mg Xr24h-tab (Metformin hcl) .Marland Kitchen.. 1 tablet by mouth daily for blood sugar 5)  Glucometer Elite Classic Kit (Blood glucose monitoring suppl) .... Dispense glucometer to check blood sugar daily dx 250.00 6)  Glucometer Elite Test  Strp (Glucose blood) .... Use to test blood sugar once daily before breakfast dx 250.00 7)  Norvasc 5 Mg Tabs (Amlodipine besylate) .... Take one tablet by mouth daily for blood pressure 8)  Viagra 100 Mg Tabs (Sildenafil citrate) .... One tablet by mouth 30 minutes  before sexual activity 9)  Voltaren 1 % Gel (Diclofenac sodium) .... Apply 4gm to left hip topically two times a day as needed 10)  Topamax 100 Mg Tabs (Topiramate) .... 2 tablets by mouth daily  Patient Instructions: 1)  You will need to go directly from this office to the ER. 2)  Concerning are your headaches, weakness and studdering. 3)  You need to be assess by neurology ASAP.   Last LDL:  122 (01/11/2010 3:29:00 AM)        Diabetic Foot Exam Last Podiatry Exam Date: 07/13/2010  Foot Inspection Is there a history of a foot ulcer?              No Is there a foot ulcer now?              No Can the patient see the bottom of their feet?          Yes Are the shoes appropriate in style and fit?          Yes Is there swelling or an abnormal foot shape?          No Are the toenails long?                No Are the toenails thick?                No Are the toenails ingrown?              No Is there heavy callous build-up?              No Is there a claw toe deformity?                          No Is there elevated skin temperature?            No Is there limited ankle dorsiflexion?            No Is there foot or ankle muscle weakness?            No Do you have pain in calf while walking?           No         10-g (5.07) Semmes-Weinstein Monofilament Test Performed by: Armenia Shannon          Right Foot          Left Foot Visual Inspection               Test Control      normal         normal Site 1         normal         normal Site 2         normal         normal Site 3         normal         normal Site 4         normal         normal Site 5         normal          normal Site 6         normal         normal Site 7         normal         normal Site 8         normal         normal Site 9         normal         normal Site 10         normal         normal  Impression      normal         normal   Laboratory Results   Blood Tests  Date/Time Received: July 13, 2010 8:58 AM   HGBA1C: 5.9%   (Normal Range: Non-Diabetic - 3-6%   Control Diabetic - 6-8%)     Appended Document: Studdering/Weakness Pt given the option of going to the ER via EMS - declined. Symptoms ongoing for 2 weeks so he needs assessement  Pt indicated that his mother dropped him off and he will call her to pick him up ER called and spoke with Wilkie Aye - report given. Copy of today's visit given to pt to take to ER Monitored pt walking to the front lobby where he will call his mother for transport Pt advised NOT to drive

## 2010-11-28 NOTE — Letter (Signed)
Summary: Handout Printed  Printed Handout:  - Kidney Stones (Ureteral Lithiasis) 

## 2010-11-30 NOTE — Letter (Signed)
Summary: HANDICAPPED PLACARD  HANDICAPPED PLACARD   Imported By: Arta Bruce 10/12/2010 14:35:35  _____________________________________________________________________  External Attachment:    Type:   Image     Comment:   External Document

## 2010-11-30 NOTE — Letter (Signed)
Summary: [facility] PATHOLOGY SERVICE  [facility] PATHOLOGY SERVICE   Imported By: Arta Bruce 10/10/2010 16:21:11  _____________________________________________________________________  External Attachment:    Type:   Image     Comment:   External Document

## 2010-11-30 NOTE — Progress Notes (Signed)
Summary: DROPPED OFF HANDICAPP APP  Phone Note Call from Patient Call back at Home Phone [phone]   Summary of Call: MARTIN PT. MR Rosol DROPPED OFF A HANDICAPP APPLICATION Initial call taken by: Leodis Rains,  October 12, 2010 10:14 AM  Follow-up for Phone Call        form completed  make copy for chart and notify  pt to come pick up Follow-up by: Lehman Prom FNP,  October 12, 2010 2:16 PM  Additional Follow-up for Phone Call Additional follow up Details #1::        CALLED PT TO PICK UP Additional Follow-up by: Arta Bruce,  October 12, 2010 2:46 PM

## 2010-11-30 NOTE — Letter (Signed)
Summary: [facility] SPEECH REEVALUATION   [facility]VALU[facility]   Imported ByArta Bruce 11/14/2010 15:25:50  _____________________________________________________________________  External Attachment:    Type:   Image     Comment:   External Document

## 2010-11-30 NOTE — Letter (Signed)
Summary: [facility] PATHOLOGY  [facility]SPEECH PATHOLOGY   Imported By: Arta Bruce 11/14/2010 15:22:24  _____________________________________________________________________  External Attachment:    Type:   Image     Comment:   External Document

## 2010-12-06 NOTE — Assessment & Plan Note (Signed)
Summary: Diabetes/HTN   Vital Signs:  Patient profile:   45 year old male Weight:      306.38 pounds Temp:     97.7 degrees F oral Pulse rate:   70 / minute Pulse rhythm:   regular Resp:     18 per minute BP sitting:   140 / 92  (left arm) Cuff size:   regular  Vitals Entered By: Hale Drone CMA (November 27, 2010 8:39 AM) CC: 2 month f/u on DM. , Hypertension Management Is Patient Diabetic? Yes CBG Result 124 CBG Device ID A non fasting  Does patient need assistance? Functional Status Self care Ambulation Normal   CC:  2 month f/u on DM.  and Hypertension Management.  History of Present Illness:  Pt into the office for f/u on diabetes.  Left hip pain - Pt went to [facility] on last week and he was referred for injection in his back.  He did get the injection and was given relief for 5 days.  He does go to see Ortho also at [facility]. Pt has started rehab - Frequency/Duration: 2x/week for 4 weeks (presents today with eval sheet)  Meds not present today in office however pt is very knowledgeble about the meds.  Diabetes Management History:      The patient is a 45 years old male who comes in for evaluation of Type 2 Diabetes Mellitus.  He has not been enrolled in the "Diabetic Education Program".  He states lack of understanding of dietary principles and is not following his diet appropriately.  No sensory loss is reported.  Self foot exams are not being performed.  He is not checking home blood sugars.  He says that he is exercising.        Hypoglycemic symptoms are not occurring.  No hyperglycemic symptoms are reported.        No changes have been made to his treatment plan since last visit.    Hypertension History:      He denies headache, chest pain, and palpitations.  He notes no problems with any antihypertensive medication side effects.  pt took BP meds just prior to coming into the office.  Pt has increase his amlodipine from 5mg  to 10mg  as ordered during last visit.         Positive major cardiovascular risk factors include diabetes and hypertension.  Negative major cardiovascular risk factors include male age less than 13 years old and non-tobacco-user status.        Further assessment for target organ damage reveals no history of ASHD, cardiac end-organ damage (CHF/LVH), stroke/TIA, peripheral vascular disease, renal insufficiency, or hypertensive retinopathy.     Current Medications (verified): 1)  Metoprolol Tartrate 100 Mg Tabs (Metoprolol Tartrate) .... One Tablet By Mouth Two Times A Day For Blood Pressure 2)  Omeprazole 20 Mg Cpdr (Omeprazole) .... One Tablet By Mouth Two Times A Day Before Meals 3)  Lisinopril-Hydrochlorothiazide 20-25 Mg Tabs (Lisinopril-Hydrochlorothiazide) .... One Tablet By Mouth Daily For Blood Pressure 4)  Glucophage Xr 500 Mg Xr24h-Tab (Metformin Hcl) .Marland Kitchen.. 1 Tablet By Mouth Daily For Blood Sugar 5)  Glucometer Elite Classic  Kit (Blood Glucose Monitoring Suppl) .... Dispense Glucometer To Check Blood Sugar Daily Dx 250.00 6)  Glucometer Elite Test  Strp (Glucose Blood) .... Use To Test Blood Sugar Once Daily Before Breakfast Dx 250.00 7)  Norvasc 10 Mg Tabs (Amlodipine Besylate) .... One Tablet By Mouth Daily  **note Change in Dose** 8)  Viagra 100  Mg Tabs (Sildenafil Citrate) .... One Tablet By Mouth 30 Minutes  Before Sexual Activity 9)  Voltaren 1 % Gel (Diclofenac Sodium) .... Apply 4gm To Left Hip Topically Two Times A Day As Needed 10)  Topamax 100 Mg Tabs (Topiramate) .... 2 Tablets By Mouth Daily For Headaches 11)  Nortriptyline Hcl 10 Mg Caps (Nortriptyline Hcl) .... 3 Capsules By Mouth Nightly 12)  Vicodin 5-500 Mg Tabs (Hydrocodone-Acetaminophen) .... One Tablet By Mouth Daily As Needed Pain  Allergies (verified): No Known Drug Allergies  Review of Systems General:  Denies fever. ENT:  soft spoken voice. CV:  Denies chest pain or discomfort. Resp:  Denies cough. GI:  Denies abdominal pain, nausea, and  vomiting. MS:  Complains of joint pain; left hip and leg pain.  Physical Exam  General:  alert.  Obese Head:  normocephalic.   Lungs:  normal breath sounds.   Heart:  normal rate and regular rhythm.   Abdomen:  obese Neurologic:  cane use  Skin:  color normal.   Psych:  Oriented X3.    Diabetes Management Exam:    Foot Exam (with socks and/or shoes not present):       Sensory-Monofilament:          Left foot: normal          Right foot: normal   Impression & Recommendations:  Problem # 1:  DIABETES MELLITUS (ICD-250.00) Diabetes is doing well. advised pt to keep taking meds as ordered His updated medication list for this problem includes:    Lisinopril-hydrochlorothiazide 20-25 Mg Tabs (Lisinopril-hydrochlorothiazide) ..... One tablet by mouth daily for blood pressure    Glucophage Xr 500 Mg Xr24h-tab (Metformin hcl) .Marland Kitchen... 1 tablet by mouth daily for blood sugar  Orders: Capillary Blood Glucose/CBG (82948) Hemoglobin A1C (83036) T-Urine Microalbumin w/creat. ratio [phone]) UA Dipstick w/o Micro (manual) (19147)  Problem # 2:  HYPERTENSION, BENIGN ESSENTIAL (ICD-401.1) DASH diet keep taking current meds His updated medication list for this problem includes:    Metoprolol Tartrate 100 Mg Tabs (Metoprolol tartrate) ..... One tablet by mouth two times a day for blood pressure    Lisinopril-hydrochlorothiazide 20-25 Mg Tabs (Lisinopril-hydrochlorothiazide) ..... One tablet by mouth daily for blood pressure    Norvasc 10 Mg Tabs (Amlodipine besylate) ..... One tablet by mouth daily  **note change in dose**  Problem # 3:  OBESITY (ICD-278.00) ongoing efforts with pt to lose weight  Problem # 4:  HIP PAIN, LEFT (ICD-719.45) pt to continue f/u with ortho His updated medication list for this problem includes:    Vicodin 5-500 Mg Tabs (Hydrocodone-acetaminophen) ..... 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 One tablet by mouth 30 minutes  before sexual activity His updated medication list for this problem includes:    Levitra 20 Mg Tabs (Vardenafil hcl) ..... One tablet by mouth 30 minutes before sexual activity  Complete Medication List: 1)  Metoprolol Tartrate 100 Mg Tabs (Metoprolol tartrate) .... One tablet by mouth two times a day for blood pressure 2)  Omeprazole 20 Mg Cpdr (Omeprazole) .... One tablet by mouth two times a day before meals 3)  Lisinopril-hydrochlorothiazide 20-25 Mg Tabs (Lisinopril-hydrochlorothiazide) .... One tablet  by mouth daily for blood pressure 4)  Glucophage Xr 500 Mg Xr24h-tab (Metformin hcl) .Marland Kitchen.. 1 tablet by mouth daily for blood sugar 5)  Glucometer Elite Classic Kit (Blood glucose monitoring suppl) .... Dispense glucometer to check blood sugar daily dx 250.00 6)  Glucometer Elite Test Strp (Glucose blood) .... Use to test blood sugar once daily before breakfast dx 250.00 7)  Norvasc 10 Mg Tabs (Amlodipine besylate) .... One tablet by mouth daily  **note change in dose** 8)  Voltaren 1 % Gel (Diclofenac sodium) .... Apply 4gm to left hip topically two times a day as needed 9)  Topamax 100 Mg Tabs (Topiramate) .... 2 tablets by mouth daily for headaches 10)  Vicodin 5-500 Mg Tabs (Hydrocodone-acetaminophen) .... One tablet by mouth daily as needed pain 11)  Levitra 20 Mg Tabs (Vardenafil hcl) .... One tablet by mouth 30 minutes before sexual activity  Diabetes Management Assessment/Plan:      His blood pressure goal is < 130/80.    Hypertension Assessment/Plan:      The patient's hypertensive risk group is category C: Target organ damage and/or diabetes.  His calculated 10 year risk of coronary  heart disease is 11 %.  Today's blood pressure is 140/92.  His blood pressure goal is < 130/80.  Patient Instructions: 1)  Diabetes - Blood sugar is doing well.   2)  Blood Pressure - Keep taking your blood pressure meds as ordered. Be sure to avoid sodium in your diet. 3)  Hip - Keep up the appointments with orthopedic in [facility] as ordered 4)  Follow up in 3 months for diabetes. 5)  Will need testostone level checked Prescriptions: LEVITRA 20 MG TABS (VARDENAFIL HCL) One tablet by mouth 30 minutes before sexual activity  #10 x 0   Entered and Authorized by:   Lehman Prom FNP   Signed by:   Lehman Prom FNP on 11/27/2010   Method used:   Print then Give to Patient   RxID:   [id number] GLUCOPHAGE XR 500 MG XR24H-TAB (METFORMIN HCL) 1 tablet by mouth daily for blood sugar  #90 Tablet x 3   Entered and Authorized by:   Lehman Prom FNP   Signed by:   Lehman Prom FNP on 11/27/2010   Method used:   Print then Give to Patient   RxID:   [id number] LISINOPRIL-HYDROCHLOROTHIAZIDE 20-25 MG TABS (LISINOPRIL-HYDROCHLOROTHIAZIDE) One tablet by mouth daily for blood pressure  #90 Tablet x 3   Entered and Authorized by:   Lehman Prom FNP   Signed by:   Lehman Prom FNP on 11/27/2010   Method used:   Print then Give to Patient   RxID:   [id number] METOPROLOL TARTRATE 100 MG TABS (METOPROLOL TARTRATE) One tablet by mouth two times a day for blood pressure  #180 Tablet x 3   Entered and Authorized by:   Lehman Prom FNP   Signed by:   Lehman Prom FNP on 11/27/2010   Method used:   Print then Give to Patient   RxID:   [id number]    Orders Added: 1)  Capillary Blood Glucose/CBG [84166] 2)  Est. Patient Level IV [06301] 3)  Hemoglobin A1C [83036] 4)  T-Urine Microalbumin w/creat. ratio [[id number]] 5)  UA Dipstick w/o Micro (manual) [81002]     Diabetic Foot Exam Last Podiatry Exam Date: 07/13/2010    10-g (5.07) Semmes-Weinstein  Monofilament Test Performed by: Hale Drone CMA          Right Foot  Left Foot Visual Inspection     normal         normal Test Control      normal         normal Site 1         normal         normal Site 2         normal         normal Site 3         normal         normal Site 4         normal         normal Site 5         normal         normal Site 6         normal         normal Site 7         normal         normal Site 8         normal         normal Site 9         normal         normal Site 10         normal         normal  Impression      normal         normal     Laboratory Results   Urine Tests  Date/Time Received: November 27, 2010 9:49 AM   Routine Urinalysis   Color: lt. yellow Appearance: Clear Glucose: negative   (Normal Range: Negative) Bilirubin: negative   (Normal Range: Negative) Ketone: negative   (Normal Range: Negative) Spec. Gravity: 1.020   (Normal Range: 1.003-1.035) Blood: negative   (Normal Range: Negative) pH: 7.0   (Normal Range: 5.0-8.0) Protein: 30   (Normal Range: Negative) Urobilinogen: 0.2   (Normal Range: 0-1) Nitrite: negative   (Normal Range: Negative) Leukocyte Esterace: negative   (Normal Range: Negative)     Blood Tests   Date/Time Received: November 27, 2010   HGBA1C: 6.2%   (Normal Range: Non-Diabetic - 3-6%   Control Diabetic - 6-8%) CBG Random:: 124mg /dL      Laboratory Results   Urine Tests    Routine Urinalysis   Color: lt. yellow Appearance: Clear Glucose: negative   (Normal Range: Negative) Bilirubin: negative   (Normal Range: Negative) Ketone: negative   (Normal Range: Negative) Spec. Gravity: 1.020   (Normal Range: 1.003-1.035) Blood: negative   (Normal Range: Negative) pH: 7.0   (Normal Range: 5.0-8.0) Protein: 30   (Normal Range: Negative) Urobilinogen: 0.2   (Normal Range: 0-1) Nitrite: negative   (Normal Range: Negative) Leukocyte Esterace: negative   (Normal Range: Negative)     Blood  Tests     HGBA1C: 6.2%   (Normal Range: Non-Diabetic - 3-6%   Control Diabetic - 6-8%) CBG Random:: 124

## 2010-12-06 NOTE — Miscellaneous (Signed)
Summary: Rehab Report//PROGRESS NOTE  Rehab Report//PROGRESS NOTE   Imported By: Arta Bruce 11/30/2010 10:40:22  _____________________________________________________________________  External Attachment:    Type:   Image     Comment:   External Document

## 2010-12-12 ENCOUNTER — Telehealth (INDEPENDENT_AMBULATORY_CARE_PROVIDER_SITE_OTHER): Payer: Self-pay | Admitting: Nurse Practitioner

## 2010-12-20 NOTE — Progress Notes (Signed)
Summary: Need pharmacy where last filled meds  Phone Note Outgoing Call   Summary of Call: Received medication request for refills.  Meds refill x4 on 11/27/10.  Where did he get his meds refilled last?  Left message on answering machine for pt. to return call.  Dutch Quint RN  December 12, 2010 10:10 AM    Follow-up for Phone Call        Called pt. to query location of last refills of medication.  Pt. answered and I asked date of birth for identification.  Pt. refused and asked what I needed.  I again requested date of birth for ID purposes and he hung up on me.  Leonides Schanz notified. Follow-up by: Dutch Quint RN,  December 12, 2010 4:51 PM  Additional Follow-up for Phone Call Additional follow up Details #1::        Pt. returned call and did provide requested ID information.  Advised that we received request for medications that our records reflected still had several refills available, with no information of which pharmacy was used.  Pt. stated always uses [facility] for medications.  I advised that I would check with [facility] as to why they did not have most recent refills available.  Additional Follow-up by: Dutch Quint RN,  December 12, 2010 5:03 PM    Additional Follow-up for Phone Call Additional follow up Details #2::    Spoke with pharmacist Renae Fickle at [facility]-- they have no record of having received printed Rx from pt.  I gave information for all 3 Rx to pharmacist -- he will call pt. when prescriptions are filled.  Dutch Quint RN  December 12, 2010 5:08 PM

## 2011-01-15 ENCOUNTER — Ambulatory Visit
Admission: RE | Admit: 2011-01-15 | Discharge: 2011-01-15 | Disposition: A | Discharge status: Home / Self Care | Payer: Medicare Other | Source: Ambulatory Visit | Attending: *Deleted | Admitting: *Deleted

## 2011-01-15 ENCOUNTER — Other Ambulatory Visit: Payer: Self-pay | Admitting: Orthopedic Surgery

## 2011-01-15 DIAGNOSIS — M25569 Pain in unspecified knee: Secondary | ICD-10-CM

## 2011-01-24 ENCOUNTER — Encounter: Payer: Self-pay | Admitting: Nurse Practitioner

## 2011-01-24 ENCOUNTER — Encounter (INDEPENDENT_AMBULATORY_CARE_PROVIDER_SITE_OTHER): Payer: Self-pay | Admitting: Nurse Practitioner

## 2011-01-30 NOTE — Assessment & Plan Note (Signed)
Summary: Left knee pain   Vital Signs:  Patient profile:   45 year old male Weight:      302.4 pounds BMI:     50.50 Temp:     98.1 degrees F oral Pulse rate:   78 / minute Pulse rhythm:   regular Resp:     20 per minute BP sitting:   136 / 83  (left arm) Cuff size:   regular  Vitals Entered By: Levon Hedger (January 24, 2011 2:30 PM)  Nutrition Counseling: Patient's BMI is greater than 25 and therefore counseled on weight management options. CC: follow-up visit...preparing for left knee surgery, Hypertension Management Is Patient Diabetic? Yes Pain Assessment Patient in pain? yes     Location: knee Intensity: 8 Onset of pain  Chronic CBG Result 99  Does patient need assistance? Functional Status Self care Ambulation Normal   CC:  follow-up visit...preparing for left knee surgery and Hypertension Management.  History of Present Illness:  Pt is has been back to [facility] for evaluation on the left knee. He had an MRI here in [place] and results was sent back to Ortho  Diabetes Management History:      The patient is a 45 years old male who comes in for evaluation of Type 2 Diabetes Mellitus.  He has not been enrolled in the "Diabetic Education Program".  He states lack of understanding of dietary principles and is not following his diet appropriately.  No sensory loss is reported.  Self foot exams are not being performed.  He is not checking home blood sugars.  He says that he is exercising.        No hyperglycemic symptoms are reported.    Hypertension History:      He denies headache, chest pain, and palpitations.  He notes no problems with any antihypertensive medication side effects.        Positive major cardiovascular risk factors include diabetes and hypertension.  Negative major cardiovascular risk factors include male age less than 22 years old and non-tobacco-user status.        Further assessment for target organ damage reveals no history of ASHD,  cardiac end-organ damage (CHF/LVH), stroke/TIA, peripheral vascular disease, renal insufficiency, or hypertensive retinopathy.     Habits & Providers  Alcohol-Tobacco-Diet     Alcohol drinks/day: 0     Tobacco Status: never  Exercise-Depression-Behavior     Does Patient Exercise: yes     Exercise Counseling: to improve exercise regimen     Depression Counseling: not indicated; screening negative for depression     Drug Use: never     Seat Belt Use: 100     Sun Exposure: occasionally  Allergies (verified): No Known Drug Allergies  Review of Systems General:  Denies fever. CV:  Denies chest pain or discomfort. Resp:  Denies cough. GI:  Denies abdominal pain, nausea, and vomiting. MS:  Complains of joint pain, low back pain, and stiffness.  Physical Exam  General:  alert and overweight-appearing.   Head:  normocephalic.   Lungs:  normal breath sounds.   Heart:  normal rate and regular rhythm.   Abdomen:  normal bowel sounds.   Msk:  cane use Neurologic:  alert & oriented X3.   Skin:  color normal.   Psych:  Oriented X3.     Impression & Recommendations:  Problem # 1:  DIABETES MELLITUS (ICD-250.00) last Hgba1c - 6.2 stable today His updated medication list for this problem includes:    Lisinopril-hydrochlorothiazide 20-25  Mg Tabs (Lisinopril-hydrochlorothiazide) ..... One tablet by mouth daily for blood pressure    Glucophage Xr 500 Mg Xr24h-tab (Metformin hcl) .Marland Kitchen... 1 tablet by mouth daily for blood sugar  Orders: Capillary Blood Glucose/CBG (16109)  Problem # 2:  OBESITY (ICD-278.00) ongoing effort to lose weight but ambulation has been difficult  Problem # 3:  KNEE PAIN, LEFT (ICD-719.46) pt is to go to ortho for knee His updated medication list for this problem includes:    Vicodin 5-500 Mg Tabs (Hydrocodone-acetaminophen) ..... One tablet by mouth daily as needed pain  Problem # 4:  HIP PAIN, LEFT (ICD-719.45) ongoing f/u with ortho His updated  medication list for this problem includes:    Vicodin 5-500 Mg Tabs (Hydrocodone-acetaminophen) ..... One tablet by mouth daily as needed pain  Complete Medication List: 1)  Metoprolol Tartrate 100 Mg Tabs (Metoprolol tartrate) .... One tablet by mouth two times a day for blood pressure 2)  Omeprazole 20 Mg Cpdr (Omeprazole) .... One tablet by mouth two times a day before meals 3)  Lisinopril-hydrochlorothiazide 20-25 Mg Tabs (Lisinopril-hydrochlorothiazide) .... One tablet by mouth daily for blood pressure 4)  Glucophage Xr 500 Mg Xr24h-tab (Metformin hcl) .Marland Kitchen.. 1 tablet by mouth daily for blood sugar 5)  Glucometer Elite Classic Kit (Blood glucose monitoring suppl) .... Dispense glucometer to check blood sugar daily dx 250.00 6)  Glucometer Elite Test Strp (Glucose blood) .... Use to test blood sugar once daily before breakfast dx 250.00 7)  Norvasc 10 Mg Tabs (Amlodipine besylate) .... One tablet by mouth daily  **note change in dose** 8)  Voltaren 1 % Gel (Diclofenac sodium) .... Apply 4gm to left hip topically two times a day as needed 9)  Topamax 100 Mg Tabs (Topiramate) .... 2 tablets by mouth daily for headaches 10)  Vicodin 5-500 Mg Tabs (Hydrocodone-acetaminophen) .... One tablet by mouth daily as needed pain 11)  Levitra 20 Mg Tabs (Vardenafil hcl) .... One tablet by mouth 30 minutes before sexual activity  Diabetes Management Assessment/Plan:      His blood pressure goal is < 130/80.    Hypertension Assessment/Plan:      The patient's hypertensive risk group is category C: Target organ damage and/or diabetes.  His calculated 10 year risk of coronary heart disease is 9 %.  Today's blood pressure is 136/83.  His blood pressure goal is < 130/80.  Patient Instructions: 1)  Your Hgba1c = 6.2 on 11/27/2010.  Remember this is good for 3 months.  2)  Keep up the good work at diet and trying to get under the 300 pounds mark 3)  Keep your appointment with Ortho in [facility] as  ordered.   Orders Added: 1)  Est. Patient Level IV [60454] 2)  Capillary Blood Glucose/CBG [82948]    Laboratory Results   Blood Tests   Date/Time Received: January 26, 2011 9:41 AM   CBG Random:: 99mg /dL

## 2011-01-30 NOTE — Letter (Signed)
Summary: Handout Printed  Printed Handout:  - Knee - Meniscus Injury, Arthroscopy

## 2011-01-30 NOTE — Progress Notes (Signed)
Summary: Pt. response  Phone Note Outgoing Call   Summary of Call: Called pt. to query location of last refills of medication, as we received refill requests and per our records, pt. had refills available.   Pt. answered and I asked for his date of birth for identification.  Pt. refused and asked what I needed.  I again requested date of birth for ID purposes and he hung up on me.  Leonides Schanz notified. Initial call taken by: Dutch Quint RN,  December 12, 2010 4:54 PM  Follow-up for Phone Call        Pt. returned call and did provide requested ID information.  Advised that we received request for medications, that our records reflected still had several refills available, with no information of which pharmacy was used.  Pt. stated always uses [facility] for medications and that Zena Amos knows that.  I advised him that pts. often change pharmacies, hence the reason for my call.  He stated that he had been given a printed set of Rx at his last visit, which he brought to [facility].   I advised that I would check with [facility] as to why they did not have most recent refill information available and ensure his medications would be taken care of.  He was very abrupt, rude and dismissive in his demeanor and tone of voice.  I was extremely patient and polite as I explained the reasoning for my call.   Mr. Noboa stated that he would be in our office tomorrow to "clear it up and find out why there was no information" about his most recent refills.    I then spoke with pharmacist Renae Fickle at [facility]-- they have no record of having received printed Rx from pt.  I gave information for all 3 Rx to pharmacist -- he will call pt. when prescriptions are filled.  Hassell Halim notified regarding both phone interactions with pt. and follow-up.  Follow-up by: Dutch Quint RN,  December 12, 2010 5:18 PM

## 2011-02-03 LAB — GLUCOSE, CAPILLARY: Glucose-Capillary: 104 mg/dL — ABNORMAL HIGH (ref 70–99)

## 2011-03-08 ENCOUNTER — Other Ambulatory Visit: Payer: Self-pay | Admitting: Orthopedic Surgery

## 2011-03-08 ENCOUNTER — Other Ambulatory Visit: Payer: Self-pay | Admitting: *Deleted

## 2011-03-08 DIAGNOSIS — M25569 Pain in unspecified knee: Secondary | ICD-10-CM

## 2011-03-13 NOTE — Op Note (Signed)
Curtis Mendez, Curtis Mendez             ACCOUNT NO.:  [id number]   MEDICAL RECORD NO.:  [id number]          PATIENT TYPE:  AMB   LOCATION:  ENDO                         FACILITY:  [facility]   PHYSICIAN:  Ulyess Mort, MD  DATE OF BIRTH:  [birth year]   DATE OF PROCEDURE:  DATE OF DISCHARGE:  06/15/2009                               OPERATIVE REPORT   PROCEDURE:  Colonoscopy.   INDICATIONS:  Patient with diabetes, hypertension and a history of being  status post surgery for brain tumor, who is disabled, but otherwise was  doing well and was scheduled for a screening colonoscopic examination.  He did indicate that he has had some GERD symptoms as well and he is on  Aciphex for this.  His other medications include atenolol, riboflavin,  Percocet, Topamax, lisinopril, Glucophage, furosemide and Viagra.   PHYSICAL EXAMINATION:  GENERAL:  He is a 45 year old healthy appearing  nice looking gentleman.  VITAL SIGNS:  Blood pressure 161/118, pulse 83 and regular, respirations  12 and regular __________.  HEENT:  Oropharynx was negative.  NECK:  Negative.  CHEST:  Clear to auscultation.  HEART:  Revealed a regular rhythm without a significant murmur.  ABDOMEN:  Somewhat protuberant and soft.  No masses or organomegaly were  detected on palpation.  EXTREMITIES:  Unremarkable.  RECTAL:  Unremarkable.  Prostate was normal to palpation.   DESCRIPTION OF PROCEDURE:  The patient was premedicated with 100 mcg of  Fentanyl IV, 10 mg of Versed IV and 50 mg of Benadryl IV.  The Olympus  videoscope was advanced with ease around the entire colon.  On  retraction, there was a fairly well prepped colon that did not reveal  any mucosal lesions of significance.  The patient tolerated the  procedure nicely.  The endoscope was inverted in the rectum and the  entire area was well visualized.  Then it was turned back into the  normal position and retracted completely.  The patient tolerated the  procedure  nicely.   IMPRESSION:  Rectum to cecum, essentially normal examination as  described.   PLAN:  I think this patient should have a repeat colonoscopic  examination in 10 years as a routine unless there is evidence of  bleeding or change in lower GI symptoms.      Ulyess Mort, MD  Electronically Signed     SML/MEDQ  D:  06/15/2009  T:  06/16/2009  Job:  [id number]   cc:   [facility]  [address]  [place], [place] [address]

## 2011-09-05 ENCOUNTER — Ambulatory Visit: Payer: Medicare Other | Attending: Orthopedic Surgery | Admitting: Physical Therapy

## 2011-09-05 DIAGNOSIS — M25569 Pain in unspecified knee: Secondary | ICD-10-CM | POA: Insufficient documentation

## 2011-09-05 DIAGNOSIS — IMO0001 Reserved for inherently not codable concepts without codable children: Secondary | ICD-10-CM | POA: Insufficient documentation

## 2011-09-05 DIAGNOSIS — M25669 Stiffness of unspecified knee, not elsewhere classified: Secondary | ICD-10-CM | POA: Insufficient documentation

## 2011-09-05 DIAGNOSIS — R5381 Other malaise: Secondary | ICD-10-CM | POA: Insufficient documentation

## 2011-09-05 DIAGNOSIS — M6281 Muscle weakness (generalized): Secondary | ICD-10-CM | POA: Insufficient documentation

## 2011-09-11 ENCOUNTER — Ambulatory Visit: Payer: Medicare Other | Admitting: Physical Therapy

## 2011-09-13 ENCOUNTER — Ambulatory Visit: Payer: Medicare Other | Admitting: Physical Therapy

## 2011-09-17 ENCOUNTER — Ambulatory Visit: Payer: Medicare Other | Admitting: Physical Therapy

## 2011-09-18 ENCOUNTER — Encounter: Payer: Medicare Other | Admitting: Physical Therapy

## 2011-09-25 ENCOUNTER — Ambulatory Visit: Payer: Medicare Other | Admitting: Physical Therapy

## 2011-09-27 ENCOUNTER — Encounter: Payer: Medicare Other | Admitting: Physical Therapy

## 2011-10-02 ENCOUNTER — Ambulatory Visit: Payer: Medicare Other | Attending: Orthopedic Surgery | Admitting: Physical Therapy

## 2011-10-02 DIAGNOSIS — M6281 Muscle weakness (generalized): Secondary | ICD-10-CM | POA: Insufficient documentation

## 2011-10-02 DIAGNOSIS — R5381 Other malaise: Secondary | ICD-10-CM | POA: Insufficient documentation

## 2011-10-02 DIAGNOSIS — M25569 Pain in unspecified knee: Secondary | ICD-10-CM | POA: Insufficient documentation

## 2011-10-02 DIAGNOSIS — M25669 Stiffness of unspecified knee, not elsewhere classified: Secondary | ICD-10-CM | POA: Insufficient documentation

## 2011-10-02 DIAGNOSIS — IMO0001 Reserved for inherently not codable concepts without codable children: Secondary | ICD-10-CM | POA: Insufficient documentation

## 2011-10-04 ENCOUNTER — Encounter: Payer: Medicare Other | Admitting: Physical Therapy

## 2011-10-09 ENCOUNTER — Encounter: Payer: Medicare Other | Admitting: Physical Therapy

## 2011-10-11 ENCOUNTER — Encounter: Payer: Medicare Other | Admitting: Physical Therapy

## 2011-10-16 ENCOUNTER — Encounter: Payer: Medicare Other | Admitting: Physical Therapy

## 2011-10-18 ENCOUNTER — Encounter: Payer: Medicare Other | Admitting: Physical Therapy

## 2012-02-27 ENCOUNTER — Other Ambulatory Visit (HOSPITAL_COMMUNITY): Payer: Self-pay | Admitting: Orthopaedic Surgery

## 2012-04-28 ENCOUNTER — Encounter (HOSPITAL_COMMUNITY): Payer: Self-pay | Admitting: Pharmacy Technician

## 2012-04-30 ENCOUNTER — Encounter (HOSPITAL_COMMUNITY)
Admission: RE | Admit: 2012-04-30 | Discharge: 2012-04-30 | Disposition: A | Discharge status: Home / Self Care | Payer: Medicare Other | Source: Ambulatory Visit | Attending: Orthopaedic Surgery | Admitting: Orthopaedic Surgery

## 2012-04-30 ENCOUNTER — Encounter (HOSPITAL_COMMUNITY): Payer: Self-pay | Admitting: Pharmacy Technician

## 2012-04-30 ENCOUNTER — Ambulatory Visit (HOSPITAL_COMMUNITY)
Admission: RE | Admit: 2012-04-30 | Discharge: 2012-04-30 | Disposition: A | Discharge status: Home / Self Care | Payer: Medicare Other | Source: Ambulatory Visit | Attending: Orthopaedic Surgery | Admitting: Orthopaedic Surgery

## 2012-04-30 ENCOUNTER — Encounter (HOSPITAL_COMMUNITY): Payer: Self-pay

## 2012-04-30 ENCOUNTER — Other Ambulatory Visit: Payer: Self-pay

## 2012-04-30 DIAGNOSIS — T8859XA Other complications of anesthesia, initial encounter: Secondary | ICD-10-CM

## 2012-04-30 DIAGNOSIS — M1990 Unspecified osteoarthritis, unspecified site: Secondary | ICD-10-CM

## 2012-04-30 DIAGNOSIS — Z01812 Encounter for preprocedural laboratory examination: Secondary | ICD-10-CM | POA: Insufficient documentation

## 2012-04-30 DIAGNOSIS — T84099A Other mechanical complication of unspecified internal joint prosthesis, initial encounter: Secondary | ICD-10-CM | POA: Insufficient documentation

## 2012-04-30 DIAGNOSIS — Z96659 Presence of unspecified artificial knee joint: Secondary | ICD-10-CM | POA: Insufficient documentation

## 2012-04-30 DIAGNOSIS — I517 Cardiomegaly: Secondary | ICD-10-CM | POA: Insufficient documentation

## 2012-04-30 DIAGNOSIS — Z01810 Encounter for preprocedural cardiovascular examination: Secondary | ICD-10-CM | POA: Insufficient documentation

## 2012-04-30 DIAGNOSIS — Y831 Surgical operation with implant of artificial internal device as the cause of abnormal reaction of the patient, or of later complication, without mention of misadventure at the time of the procedure: Secondary | ICD-10-CM | POA: Insufficient documentation

## 2012-04-30 HISTORY — DX: Unspecified osteoarthritis, unspecified site: M19.90

## 2012-04-30 HISTORY — DX: Essential (primary) hypertension: I10

## 2012-04-30 HISTORY — PX: JOINT REPLACEMENT: SHX530

## 2012-04-30 HISTORY — DX: Other complications of anesthesia, initial encounter: T88.59XA

## 2012-04-30 HISTORY — DX: Adverse effect of unspecified anesthetic, initial encounter: T41.45XA

## 2012-04-30 LAB — CBC
HCT: 41.4 % (ref 39.0–52.0)
Hemoglobin: 14.2 g/dL (ref 13.0–17.0)
MCH: 27 pg (ref 26.0–34.0)
MCV: 78.7 fL (ref 78.0–100.0)
RBC: 5.26 MIL/uL (ref 4.22–5.81)

## 2012-04-30 LAB — BASIC METABOLIC PANEL
CO2: 30 mEq/L (ref 19–32)
Glucose, Bld: 112 mg/dL — ABNORMAL HIGH (ref 70–99)
Potassium: 4.1 mEq/L (ref 3.5–5.1)
Sodium: 136 mEq/L (ref 135–145)

## 2012-04-30 LAB — URINE MICROSCOPIC-ADD ON

## 2012-04-30 LAB — URINALYSIS, ROUTINE W REFLEX MICROSCOPIC
Bilirubin Urine: NEGATIVE
Glucose, UA: NEGATIVE mg/dL
Ketones, ur: NEGATIVE mg/dL
pH: 7 (ref 5.0–8.0)

## 2012-04-30 NOTE — Pre-Procedure Instructions (Signed)
04-30-12 EKG/ CXR done today.

## 2012-04-30 NOTE — Patient Instructions (Addendum)
20 CUSTER PIMENTA  04/30/2012   Your procedure is scheduled on:   05-02-2012  Report to [facility] at   0530     AM.  Call this number if you have problems the morning of surgery: [phone]   Remember:   Do not eat food:After Midnight.  Take these medicines the morning of surgery with A SIP OF WATER: Norvasc,Metoprolol   Do not wear jewelry, make-up or nail polish.  Do not wear lotions, powders, or perfumes. You may wear deodorant.  Do not shave 48 hours prior to surgery.(face and neck okay, no shaving of legs)  Do not bring valuables to the hospital.  Contacts, dentures or bridgework may not be worn into surgery.  Leave suitcase in the car. After surgery it may be brought to your room.  For patients admitted to the hospital, checkout time is 11:00 AM the day of discharge.   Patients discharged the day of surgery will not be allowed to drive home.  Name and phone number of your driver:friend   Special Instructions: CHG Shower Use Special Wash: 1/2 bottle night before surgery and 1/2 bottle morning of surgery.(avoid face and genitals)   Please read over the following fact sheets that you were given: MRSA Information, Blood Transfusion fact sheet, Incentive Spirometry Instruction.

## 2012-05-01 NOTE — Anesthesia Preprocedure Evaluation (Addendum)
Anesthesia Evaluation  Patient identified by MRN, date of birth, ID band Patient awake    Reviewed: Allergy & Precautions, H&P , NPO status , Patient's Chart, lab work & pertinent test results, reviewed documented beta blocker date and time   Airway Mallampati: III TM Distance: >3 FB Neck ROM: full    Dental No notable dental hx. 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 breath sounds clear to auscultation  Pulmonary exam normal       Cardiovascular Exercise Tolerance: Good hypertension, Pt. on home beta blockers negative cardio ROS  Rhythm:regular Rate:Normal     Neuro/Psych  Headaches, Cervical HNP.  Thoracic spondylosis with myelopathy. negative neurological ROS  negative psych ROS   GI/Hepatic negative GI ROS, Neg liver ROS,   Endo/Other  negative endocrine ROSDiabetes mellitus-, Well Controlled, Type 2, Oral Hypoglycemic AgentsMorbid obesity  Renal/GU negative Renal ROS  negative genitourinary   Musculoskeletal   Abdominal   Peds  Hematology negative hematology ROS (+)   Anesthesia Other Findings   Reproductive/Obstetrics negative OB ROS                         Anesthesia Physical Anesthesia Plan  ASA: III  Anesthesia Plan: General   Post-op Pain Management:    Induction: Intravenous  Airway Management Planned: Oral ETT  Additional Equipment:   Intra-op Plan:   Post-operative Plan: Extubation in OR  Informed Consent: I have reviewed the patients History and Physical, chart, labs and discussed the procedure including the risks, benefits and alternatives for the proposed anesthesia with the patient or authorized representative who has indicated his/her understanding and acceptance.   Dental Advisory Given  Plan Discussed with: CRNA and Surgeon  Anesthesia Plan Comments:          Anesthesia Quick Evaluation

## 2012-05-02 ENCOUNTER — Encounter (HOSPITAL_COMMUNITY): Payer: Self-pay | Admitting: *Deleted

## 2012-05-02 ENCOUNTER — Encounter (HOSPITAL_COMMUNITY)
Admission: RE | Disposition: A | Discharge status: Home Health | Payer: Self-pay | Source: Ambulatory Visit | Attending: Orthopaedic Surgery

## 2012-05-02 ENCOUNTER — Encounter (HOSPITAL_COMMUNITY): Payer: Self-pay | Admitting: Anesthesiology

## 2012-05-02 ENCOUNTER — Inpatient Hospital Stay (HOSPITAL_COMMUNITY)
Admission: RE | Admit: 2012-05-02 | Discharge: 2012-05-04 | DRG: 467 | Disposition: A | Discharge status: Home Health | Payer: Medicare Other | Source: Ambulatory Visit | Attending: Orthopaedic Surgery | Admitting: Orthopaedic Surgery

## 2012-05-02 ENCOUNTER — Ambulatory Visit (HOSPITAL_COMMUNITY): Payer: Medicare Other | Admitting: Anesthesiology

## 2012-05-02 ENCOUNTER — Ambulatory Visit (HOSPITAL_COMMUNITY): Payer: Medicare Other

## 2012-05-02 DIAGNOSIS — Z6841 Body Mass Index (BMI) 40.0 and over, adult: Secondary | ICD-10-CM

## 2012-05-02 DIAGNOSIS — Z96659 Presence of unspecified artificial knee joint: Secondary | ICD-10-CM

## 2012-05-02 DIAGNOSIS — M1710 Unilateral primary osteoarthritis, unspecified knee: Secondary | ICD-10-CM | POA: Diagnosis present

## 2012-05-02 DIAGNOSIS — T84039A Mechanical loosening of unspecified internal prosthetic joint, initial encounter: Principal | ICD-10-CM | POA: Diagnosis present

## 2012-05-02 DIAGNOSIS — E119 Type 2 diabetes mellitus without complications: Secondary | ICD-10-CM | POA: Diagnosis present

## 2012-05-02 DIAGNOSIS — I10 Essential (primary) hypertension: Secondary | ICD-10-CM | POA: Diagnosis present

## 2012-05-02 DIAGNOSIS — Y831 Surgical operation with implant of artificial internal device as the cause of abnormal reaction of the patient, or of later complication, without mention of misadventure at the time of the procedure: Secondary | ICD-10-CM | POA: Diagnosis present

## 2012-05-02 DIAGNOSIS — T84038A Mechanical loosening of other internal prosthetic joint, initial encounter: Secondary | ICD-10-CM

## 2012-05-02 HISTORY — PX: TOTAL KNEE REVISION: SHX996

## 2012-05-02 LAB — GLUCOSE, CAPILLARY

## 2012-05-02 SURGERY — TOTAL KNEE REVISION
Anesthesia: General | Site: Knee | Laterality: Left | Wound class: Clean

## 2012-05-02 MED ORDER — DEXAMETHASONE SODIUM PHOSPHATE 4 MG/ML IJ SOLN
INTRAMUSCULAR | Status: DC | PRN
  Administered 2012-05-02: 10 mg via INTRAVENOUS

## 2012-05-02 MED ORDER — ONDANSETRON HCL 4 MG PO TABS: 4.0000 mg | ORAL_TABLET | Freq: Four times a day (QID) | ORAL | Status: DC | PRN

## 2012-05-02 MED ORDER — ACETAMINOPHEN 325 MG PO TABS: 650.0000 mg | ORAL_TABLET | Freq: Four times a day (QID) | ORAL | Status: DC | PRN

## 2012-05-02 MED ORDER — LACTATED RINGERS IV SOLN: INTRAVENOUS | Status: DC

## 2012-05-02 MED ORDER — HYDROCHLOROTHIAZIDE 25 MG PO TABS
25.0000 mg | ORAL_TABLET | Freq: Every day | ORAL | Status: DC
  Administered 2012-05-02 – 2012-05-04 (×3): 25 mg via ORAL
  Filled 2012-05-02 (×3): qty 1

## 2012-05-02 MED ORDER — GLYCOPYRROLATE 0.2 MG/ML IJ SOLN
INTRAMUSCULAR | Status: DC | PRN
  Administered 2012-05-02: 0.2 mg via INTRAVENOUS
  Administered 2012-05-02: 0.4 mg via INTRAVENOUS
  Administered 2012-05-02: 0.2 mg via INTRAVENOUS

## 2012-05-02 MED ORDER — KETAMINE HCL 10 MG/ML IJ SOLN
INTRAMUSCULAR | Status: DC | PRN
  Administered 2012-05-02: 30 mg via INTRAVENOUS
  Administered 2012-05-02 (×4): 5 mg via INTRAVENOUS

## 2012-05-02 MED ORDER — METOCLOPRAMIDE HCL 5 MG/ML IJ SOLN
INTRAMUSCULAR | Status: DC | PRN
  Administered 2012-05-02: 10 mg via INTRAVENOUS

## 2012-05-02 MED ORDER — ACETAMINOPHEN 10 MG/ML IV SOLN
INTRAVENOUS | Status: AC
  Filled 2012-05-02: qty 100

## 2012-05-02 MED ORDER — HYDROMORPHONE 0.3 MG/ML IV SOLN
INTRAVENOUS | Status: AC
  Filled 2012-05-02: qty 25

## 2012-05-02 MED ORDER — PROPOFOL 10 MG/ML IV EMUL
INTRAVENOUS | Status: DC | PRN
  Administered 2012-05-02: 200 mg via INTRAVENOUS

## 2012-05-02 MED ORDER — 0.9 % SODIUM CHLORIDE (POUR BTL) OPTIME
TOPICAL | Status: DC | PRN
  Administered 2012-05-02: 1000 mL

## 2012-05-02 MED ORDER — DEXTROSE 5 % IV SOLN
3.0000 g | INTRAVENOUS | Status: AC
  Administered 2012-05-02: 3 g via INTRAVENOUS
  Filled 2012-05-02: qty 30

## 2012-05-02 MED ORDER — SODIUM CHLORIDE 0.9 % IR SOLN
Status: DC | PRN
  Administered 2012-05-02: 3000 mL

## 2012-05-02 MED ORDER — HYDROMORPHONE HCL PF 1 MG/ML IJ SOLN
0.2500 mg | INTRAMUSCULAR | Status: DC | PRN
  Administered 2012-05-02 (×2): 0.5 mg via INTRAVENOUS

## 2012-05-02 MED ORDER — ASPIRIN EC 325 MG PO TBEC
325.0000 mg | DELAYED_RELEASE_TABLET | Freq: Two times a day (BID) | ORAL | Status: DC
  Administered 2012-05-03 – 2012-05-04 (×3): 325 mg via ORAL
  Filled 2012-05-02 (×5): qty 1

## 2012-05-02 MED ORDER — LACTATED RINGERS IV SOLN
INTRAVENOUS | Status: DC | PRN
  Administered 2012-05-02 (×3): via INTRAVENOUS

## 2012-05-02 MED ORDER — KETOROLAC TROMETHAMINE 15 MG/ML IJ SOLN
7.5000 mg | Freq: Four times a day (QID) | INTRAMUSCULAR | Status: AC
  Administered 2012-05-02 – 2012-05-03 (×4): 7.5 mg via INTRAVENOUS
  Filled 2012-05-02 (×4): qty 1

## 2012-05-02 MED ORDER — SODIUM CHLORIDE 0.9 % IJ SOLN: 9.0000 mL | INTRAMUSCULAR | Status: DC | PRN

## 2012-05-02 MED ORDER — DIPHENHYDRAMINE HCL 12.5 MG/5ML PO ELIX: 12.5000 mg | ORAL_SOLUTION | ORAL | Status: DC | PRN

## 2012-05-02 MED ORDER — LIDOCAINE HCL (CARDIAC) 20 MG/ML IV SOLN
INTRAVENOUS | Status: DC | PRN
  Administered 2012-05-02: 50 mg via INTRAVENOUS

## 2012-05-02 MED ORDER — FENTANYL CITRATE 0.05 MG/ML IJ SOLN
INTRAMUSCULAR | Status: DC | PRN
  Administered 2012-05-02 (×7): 50 ug via INTRAVENOUS

## 2012-05-02 MED ORDER — METOPROLOL SUCCINATE ER 100 MG PO TB24
100.0000 mg | ORAL_TABLET | Freq: Every day | ORAL | Status: DC
  Administered 2012-05-03 – 2012-05-04 (×2): 100 mg via ORAL
  Filled 2012-05-02 (×2): qty 1

## 2012-05-02 MED ORDER — METFORMIN HCL 500 MG PO TABS
500.0000 mg | ORAL_TABLET | Freq: Every day | ORAL | Status: DC
  Administered 2012-05-03 – 2012-05-04 (×2): 500 mg via ORAL
  Filled 2012-05-02 (×3): qty 1

## 2012-05-02 MED ORDER — ACETAMINOPHEN 10 MG/ML IV SOLN
INTRAVENOUS | Status: DC | PRN
  Administered 2012-05-02: 1000 mg via INTRAVENOUS

## 2012-05-02 MED ORDER — EPHEDRINE SULFATE 50 MG/ML IJ SOLN
INTRAMUSCULAR | Status: DC | PRN
  Administered 2012-05-02 (×2): 5 mg via INTRAVENOUS

## 2012-05-02 MED ORDER — CEFAZOLIN SODIUM 1-5 GM-% IV SOLN
1.0000 g | Freq: Four times a day (QID) | INTRAVENOUS | Status: AC
  Administered 2012-05-02 (×2): 1 g via INTRAVENOUS
  Filled 2012-05-02 (×2): qty 50

## 2012-05-02 MED ORDER — MORPHINE SULFATE 2 MG/ML IJ SOLN: 2.0000 mg | INTRAMUSCULAR | Status: DC | PRN

## 2012-05-02 MED ORDER — ZOLPIDEM TARTRATE 5 MG PO TABS: 5.0000 mg | ORAL_TABLET | Freq: Every evening | ORAL | Status: DC | PRN

## 2012-05-02 MED ORDER — NEOSTIGMINE METHYLSULFATE 1 MG/ML IJ SOLN
INTRAMUSCULAR | Status: DC | PRN
  Administered 2012-05-02: 4 mg via INTRAVENOUS

## 2012-05-02 MED ORDER — METOCLOPRAMIDE HCL 5 MG/ML IJ SOLN: 5.0000 mg | Freq: Three times a day (TID) | INTRAMUSCULAR | Status: DC | PRN

## 2012-05-02 MED ORDER — DIPHENHYDRAMINE HCL 12.5 MG/5ML PO ELIX: 12.5000 mg | ORAL_SOLUTION | Freq: Four times a day (QID) | ORAL | Status: DC | PRN

## 2012-05-02 MED ORDER — AMLODIPINE BESYLATE 10 MG PO TABS
10.0000 mg | ORAL_TABLET | Freq: Every day | ORAL | Status: DC
  Administered 2012-05-03 – 2012-05-04 (×2): 10 mg via ORAL
  Filled 2012-05-02 (×2): qty 1

## 2012-05-02 MED ORDER — DIPHENHYDRAMINE HCL 50 MG/ML IJ SOLN: 12.5000 mg | Freq: Four times a day (QID) | INTRAMUSCULAR | Status: DC | PRN

## 2012-05-02 MED ORDER — OXYCODONE HCL 5 MG PO TABS
5.0000 mg | ORAL_TABLET | ORAL | Status: DC | PRN
  Administered 2012-05-04 (×2): 10 mg via ORAL
  Filled 2012-05-02 (×2): qty 2

## 2012-05-02 MED ORDER — MENTHOL 3 MG MT LOZG: 1.0000 | LOZENGE | OROMUCOSAL | Status: DC | PRN

## 2012-05-02 MED ORDER — ONDANSETRON HCL 4 MG/2ML IJ SOLN: 4.0000 mg | Freq: Four times a day (QID) | INTRAMUSCULAR | Status: DC | PRN

## 2012-05-02 MED ORDER — HYDROMORPHONE 0.3 MG/ML IV SOLN
INTRAVENOUS | Status: DC
  Administered 2012-05-02: 1.5 mg via INTRAVENOUS
  Administered 2012-05-02: 1.8 mg via INTRAVENOUS
  Administered 2012-05-02: 0.3 mg via INTRAVENOUS
  Administered 2012-05-02: 0.8 mg via INTRAVENOUS
  Administered 2012-05-03: 2.7 mg via INTRAVENOUS
  Administered 2012-05-03: 1.2 mg via INTRAVENOUS
  Administered 2012-05-03: 1.8 mg via INTRAVENOUS
  Administered 2012-05-03: 1.2 mg via INTRAVENOUS
  Administered 2012-05-03: 03:00:00 via INTRAVENOUS
  Administered 2012-05-03: 3 mg via INTRAVENOUS
  Administered 2012-05-03: 0.6 mg via INTRAVENOUS
  Administered 2012-05-04: 1.8 mg via INTRAVENOUS
  Administered 2012-05-04: 07:00:00 via INTRAVENOUS
  Filled 2012-05-02 (×3): qty 25

## 2012-05-02 MED ORDER — NALOXONE HCL 0.4 MG/ML IJ SOLN: 0.4000 mg | INTRAMUSCULAR | Status: DC | PRN

## 2012-05-02 MED ORDER — ROCURONIUM BROMIDE 100 MG/10ML IV SOLN
INTRAVENOUS | Status: DC | PRN
  Administered 2012-05-02: 50 mg via INTRAVENOUS

## 2012-05-02 MED ORDER — ALUM & MAG HYDROXIDE-SIMETH 200-200-20 MG/5ML PO SUSP: 30.0000 mL | ORAL | Status: DC | PRN

## 2012-05-02 MED ORDER — ACETAMINOPHEN 650 MG RE SUPP: 650.0000 mg | Freq: Four times a day (QID) | RECTAL | Status: DC | PRN

## 2012-05-02 MED ORDER — ONDANSETRON HCL 4 MG/2ML IJ SOLN
INTRAMUSCULAR | Status: DC | PRN
  Administered 2012-05-02: 4 mg via INTRAVENOUS

## 2012-05-02 MED ORDER — INSULIN ASPART 100 UNIT/ML ~~LOC~~ SOLN
0.0000 [IU] | Freq: Three times a day (TID) | SUBCUTANEOUS | Status: DC
  Administered 2012-05-02: 3 [IU] via SUBCUTANEOUS
  Administered 2012-05-03 – 2012-05-04 (×5): 2 [IU] via SUBCUTANEOUS

## 2012-05-02 MED ORDER — HYDROMORPHONE HCL PF 1 MG/ML IJ SOLN
INTRAMUSCULAR | Status: AC
  Filled 2012-05-02: qty 1

## 2012-05-02 MED ORDER — PHENOL 1.4 % MT LIQD: 1.0000 | OROMUCOSAL | Status: DC | PRN

## 2012-05-02 MED ORDER — METHOCARBAMOL 100 MG/ML IJ SOLN
500.0000 mg | Freq: Four times a day (QID) | INTRAVENOUS | Status: DC | PRN
  Filled 2012-05-02: qty 5

## 2012-05-02 MED ORDER — MIDAZOLAM HCL 5 MG/5ML IJ SOLN
INTRAMUSCULAR | Status: DC | PRN
  Administered 2012-05-02: 2 mg via INTRAVENOUS

## 2012-05-02 MED ORDER — METOCLOPRAMIDE HCL 10 MG PO TABS: 5.0000 mg | ORAL_TABLET | Freq: Three times a day (TID) | ORAL | Status: DC | PRN

## 2012-05-02 MED ORDER — METHOCARBAMOL 500 MG PO TABS
500.0000 mg | ORAL_TABLET | Freq: Four times a day (QID) | ORAL | Status: DC | PRN
  Administered 2012-05-03 – 2012-05-04 (×2): 500 mg via ORAL
  Filled 2012-05-02 (×2): qty 1

## 2012-05-02 MED ORDER — SODIUM CHLORIDE 0.9 % IV SOLN
INTRAVENOUS | Status: DC
  Administered 2012-05-02 – 2012-05-03 (×2): via INTRAVENOUS

## 2012-05-02 SURGICAL SUPPLY — 63 items
BAG SPEC THK2 15X12 ZIP CLS (MISCELLANEOUS) ×1
BAG ZIPLOCK 12X15 (MISCELLANEOUS) ×2 IMPLANT
BANDAGE ELASTIC 4 VELCRO ST LF (GAUZE/BANDAGES/DRESSINGS) ×2 IMPLANT
BANDAGE ELASTIC 6 VELCRO ST LF (GAUZE/BANDAGES/DRESSINGS) ×3 IMPLANT
BANDAGE ESMARK 6X9 LF (GAUZE/BANDAGES/DRESSINGS) ×1 IMPLANT
BLADE SAG 18X100X1.27 (BLADE) ×2 IMPLANT
BLADE SAW SGTL 13.0X1.19X90.0M (BLADE) ×2 IMPLANT
BLADE SURG SZ10 CARB STEEL (BLADE) ×1 IMPLANT
BLADE SURG SZ11 CARB STEEL (BLADE) ×1 IMPLANT
BNDG CMPR 9X6 STRL LF SNTH (GAUZE/BANDAGES/DRESSINGS) ×1
BNDG ESMARK 6X9 LF (GAUZE/BANDAGES/DRESSINGS) ×2
CEMENT HV SMART SET (Cement) ×2 IMPLANT
CLOTH BEACON ORANGE TIMEOUT ST (SAFETY) ×2 IMPLANT
CUFF TOURN SGL QUICK 34 (TOURNIQUET CUFF) ×2
CUFF TRNQT CYL 34X4X40X1 (TOURNIQUET CUFF) ×1 IMPLANT
DRAIN CHANNEL 10F 3/8 F FF (DRAIN) ×1 IMPLANT
DRAPE INCISE IOBAN 66X45 STRL (DRAPES) ×1 IMPLANT
DRAPE ORTHO SPLIT 77X108 STRL (DRAPES) ×4
DRAPE POUCH INSTRU U-SHP 10X18 (DRAPES) ×2 IMPLANT
DRAPE SURG ORHT 6 SPLT 77X108 (DRAPES) ×2 IMPLANT
DRAPE U-SHAPE 47X51 STRL (DRAPES) ×2 IMPLANT
DRSG PAD ABDOMINAL 8X10 ST (GAUZE/BANDAGES/DRESSINGS) ×3 IMPLANT
DURAPREP 26ML APPLICATOR (WOUND CARE) ×2 IMPLANT
ELECT REM PT RETURN 9FT ADLT (ELECTROSURGICAL) ×2
ELECTRODE REM PT RTRN 9FT ADLT (ELECTROSURGICAL) ×1 IMPLANT
EVACUATOR 1/8 PVC DRAIN (DRAIN) ×2 IMPLANT
FACESHIELD LNG OPTICON STERILE (SAFETY) ×10 IMPLANT
GAUZE XEROFORM 5X9 LF (GAUZE/BANDAGES/DRESSINGS) ×2 IMPLANT
GLOVE ORTHO TXT STRL SZ7.5 (GLOVE) ×2 IMPLANT
GLOVE SURG SS PI 8.0 STRL IVOR (GLOVE) ×2 IMPLANT
GOWN STRL NON-REIN LRG LVL3 (GOWN DISPOSABLE) ×1 IMPLANT
GOWN STRL REIN XL XLG (GOWN DISPOSABLE) ×3 IMPLANT
HANDPIECE INTERPULSE COAX TIP (DISPOSABLE) ×2
IMMOBILIZER KNEE 20 (SOFTGOODS) ×2
IMMOBILIZER KNEE 20 THIGH 36 (SOFTGOODS) IMPLANT
KIT BASIN OR (CUSTOM PROCEDURE TRAY) ×2 IMPLANT
NDL HYPO 21X1.5 SAFETY (NEEDLE) ×1 IMPLANT
NEEDLE HYPO 21X1.5 SAFETY (NEEDLE) ×2 IMPLANT
NS IRRIG 1000ML POUR BTL (IV SOLUTION) ×2 IMPLANT
PACK TOTAL JOINT (CUSTOM PROCEDURE TRAY) ×2 IMPLANT
PAD CAST 4YDX4 CTTN HI CHSV (CAST SUPPLIES) ×2 IMPLANT
PADDING CAST COTTON 4X4 STRL (CAST SUPPLIES) ×4
PADDING CAST COTTON 6X4 STRL (CAST SUPPLIES) ×3 IMPLANT
POSITIONER SURGICAL ARM (MISCELLANEOUS) ×2 IMPLANT
SET HNDPC FAN SPRY TIP SCT (DISPOSABLE) ×1 IMPLANT
SET PAD KNEE POSITIONER (MISCELLANEOUS) ×2 IMPLANT
SPONGE GAUZE 4X4 12PLY (GAUZE/BANDAGES/DRESSINGS) ×1 IMPLANT
SPONGE LAP 18X18 X RAY DECT (DISPOSABLE) ×2 IMPLANT
STAPLER VISISTAT 35W (STAPLE) ×2 IMPLANT
STRIP CLOSURE SKIN 1/2X4 (GAUZE/BANDAGES/DRESSINGS) ×1 IMPLANT
SUCTION FRAZIER 12FR DISP (SUCTIONS) ×2 IMPLANT
SUT ETHILON 3 0 PS 1 (SUTURE) ×1 IMPLANT
SUT VIC AB 0 CT1 27 (SUTURE) ×2
SUT VIC AB 0 CT1 27XBRD ANTBC (SUTURE) ×4 IMPLANT
SUT VIC AB 1 CT1 27 (SUTURE) ×2
SUT VIC AB 1 CT1 27XBRD ANTBC (SUTURE) ×3 IMPLANT
SUT VIC AB 2-0 CT1 27 (SUTURE) ×4
SUT VIC AB 2-0 CT1 TAPERPNT 27 (SUTURE) ×3 IMPLANT
SUT VLOC 180 0 24IN GS25 (SUTURE) ×2 IMPLANT
SYR CONTROL 10ML LL (SYRINGE) ×2 IMPLANT
TOWEL OR 17X26 10 PK STRL BLUE (TOWEL DISPOSABLE) ×6 IMPLANT
TRAY FOLEY CATH 14FRSI W/METER (CATHETERS) ×2 IMPLANT
WATER STERILE IRR 1500ML POUR (IV SOLUTION) ×2 IMPLANT

## 2012-05-02 NOTE — H&P (Signed)
Curtis Mendez is an 46 y.o. male.   Chief Complaint:   Left knee pain; failed unicompartmental knee replacement HPI:   46 yo obese male who underwent a unicompartmental left knee replacement in 2011 in [place].  He continues to experience pain and swelling with this knee and his x-rays are suspicious for loosening as well as arthritis in other compartments of his knee.  He wishe to proceed with a conversion to a total knee given his pain and decreased mobility.  He understands the risks of blood loss, deformity, fracture, nerve injury andDVT.  Past Medical History  Diagnosis Date  . 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 Hypertension   . Diabetes mellitus 04-30-12    Diabetes x2 yrs -oral meds only.  . Arthritis 04-30-12    Arthritis osteoarthritis-left knee    Past Surgical History  Procedure Date  . Joint replacement 04-30-12    LTKA 5'12-[facility]    History reviewed. No pertinent family history. Social History:  reports that he has never smoked. He does not have any smokeless tobacco history on file. He reports that he does not drink alcohol or use illicit drugs.  Allergies:  Allergies  Allergen Reactions  . Vicodin (Hydrocodone-Acetaminophen) Other (See Comments)    Pressure in chest  . Lisinopril Swelling    Medications Prior to Admission  Medication Sig Dispense Refill  . amLODipine (NORVASC) 10 MG tablet Take 10 mg by mouth daily.      . hydrochlorothiazide (HYDRODIURIL) 25 MG tablet Take 25 mg by mouth daily.      . metFORMIN (GLUCOPHAGE) 500 MG tablet Take 500 mg by mouth daily with breakfast.      . metoprolol succinate (TOPROL-XL) 100 MG 24 hr tablet Take 100 mg by mouth daily. Take with or immediately following a meal.        Results for orders placed during the hospital encounter of 05/02/12 (from the past 48 hour(s))  TYPE AND SCREEN     Status: Normal   Collection Time   05/02/12  6:00 AM      Component Value Range Comment   ABO/RH(D) O POS      Antibody Screen NEG      Sample Expiration 05/05/2012     GLUCOSE, CAPILLARY     Status: Abnormal   Collection Time   05/02/12  6:01 AM      Component Value Range Comment   Glucose-Capillary 112 (*) 70 - 99 mg/dL    Comment 1 Documented in Chart     ABO/RH     Status: Normal   Collection Time   05/02/12  6:30 AM      Component Value Range Comment   ABO/RH(D) O POS      Dg Chest 2 View  04/30/2012  *RADIOLOGY REPORT*  Clinical Data: Failed left total knee replacement, preop  CHEST - 2 VIEW  Comparison: None.  Findings: The lungs are clear.  The heart is mildly enlarged.  No acute bony abnormality is seen.  IMPRESSION: No active lung disease.  Borderline to mild cardiomegaly.  Original Report Authenticated By: Juline Patch, M.D.    Review of Systems  All other systems reviewed and are negative.    Blood pressure 123/73, pulse 71, temperature 98.3 F (36.8 C), resp. rate 20, SpO2 99.00%. Physical Exam  Constitutional: He is oriented to person, place, and time. He appears well-developed and well-nourished.  HENT:  Head: Normocephalic and atraumatic.  Eyes: EOM are normal. Pupils are equal, round, and reactive to light.  Neck: Normal range of motion. Neck supple.  Cardiovascular: Normal rate and regular rhythm.   Respiratory: Effort normal and breath sounds normal.  GI: Soft. Bowel sounds are normal.  Musculoskeletal:       Left knee: He exhibits swelling. tenderness found. Medial joint line tenderness noted.  Neurological: He is alert and oriented to person, place, and time.  Skin: Skin is warm and dry.  Psychiatric: He has a normal mood and affect.     Assessment/Plan Failed unicompartmental replacement left knee 1)  To the OR today for removal of the unicompartmental knee and revision to a total knee replacement.  , Y 05/02/2012, 7:08 AM

## 2012-05-02 NOTE — Anesthesia Procedure Notes (Signed)
Procedure Name: Intubation Date/Time: 05/02/2012 7:34 AM Performed by: Randon Goldsmith Curtis Mendez Pre-anesthesia Checklist: Patient identified, Emergency Drugs available, Suction available and Patient being monitored Patient Re-evaluated:Patient Re-evaluated prior to inductionOxygen Delivery Method: Circle system utilized Preoxygenation: Pre-oxygenation with 100% oxygen Intubation Type: IV induction Ventilation: Mask ventilation without difficulty and Oral airway inserted - appropriate to patient size Grade View: Grade I Tube type: Oral Tube size: 7.5 mm Number of attempts: 1 Airway Equipment and Method: Patient positioned with wedge pillow and Video-laryngoscopy Placement Confirmation: ETT inserted through vocal cords under direct vision,  positive ETCO2 and breath sounds checked- equal and bilateral Secured at: 21 cm Tube secured with: Tape Dental Injury: Teeth and Oropharynx as per pre-operative assessment  Comments: Smooth IV induction, easy mask with 10.0 oaw, atraumatic DVL x 1 with glidescope, due to  history of vocal cord implant. CRNA performed DVL while  MDA gently and atraumatically inserted ETT through vocal cords under direct vision.

## 2012-05-02 NOTE — Transfer of Care (Signed)
Immediate Anesthesia Transfer of Care Note  Patient: Curtis Mendez  Procedure(s) Performed: Procedure(s) (LRB): TOTAL KNEE REVISION (Left)  Patient Location: PACU  Anesthesia Type: General  Level of Consciousness: sedated, pt extubated deep, spontaneously ventilating  Airway & Oxygen Therapy: Patient Spontanous Breathing and Patient connected to face mask oxygen  Post-op Assessment: Report given to PACU RN and Post -op Vital signs reviewed and stable  Post vital signs: Reviewed, and stable  Complications: No apparent anesthesia complications

## 2012-05-02 NOTE — Brief Op Note (Signed)
05/02/2012  9:55 AM  PATIENT:  Curtis Mendez  46 y.o. male  PRE-OPERATIVE DIAGNOSIS:  failed unicompartmental left knee replacement  POST-OPERATIVE DIAGNOSIS:  failed unicompartmental left knee replacement  PROCEDURE:  Procedure(s) (LRB): TOTAL KNEE REVISION (Left)  SURGEON:  Surgeon(s) and Role:    * Kathryne Hitch, MD - Primary  PHYSICIAN ASSISTANT:   ASSISTANTS: none   ANESTHESIA:   general  EBL:  Total I/O In: 2000 [I.V.:2000] Out: 250 [Urine:200; Blood:50]  BLOOD ADMINISTERED:none  DRAINS: (medium) Hemovact drain(s) in the knee joint with  Suction Open   LOCAL MEDICATIONS USED:  NONE  SPECIMEN:  No Specimen  DISPOSITION OF SPECIMEN:  N/A  COUNTS:  YES  TOURNIQUET:   Total Tourniquet Time Documented: Thigh (Left) - 75 minutes  DICTATION: .Other Dictation: Dictation Number [id number]  PLAN OF CARE: Admit to inpatient   PATIENT DISPOSITION:  PACU - hemodynamically stable.   Delay start of Pharmacological VTE agent (>24hrs) due to surgical blood loss or risk of bleeding: not applicable

## 2012-05-02 NOTE — Preoperative (Signed)
Beta Blockers   Reason not to administer Beta Blockers:Not Applicable, pt took  Home bb this am

## 2012-05-02 NOTE — Op Note (Signed)
NAMEZYLER, HYSON NO.:  [id number]  MEDICAL RECORD NO.:  [id number]  LOCATION:  [facility]                         FACILITY:  [facility]  PHYSICIAN:  Vanita Panda. Magnus Ivan, M.D.DATE OF BIRTH:  [birth year]  DATE OF PROCEDURE:  05/02/2012 DATE OF DISCHARGE:                              OPERATIVE REPORT   PREOPERATIVE DIAGNOSIS:  Failed left unicompartmental knee arthroplasty.  POSTOPERATIVE DIAGNOSIS:  Failed left unicompartmental knee arthroplasty.  PROCEDURES: 1. Removal of left unicompartmental knee arthroplasty. 2. Conversion to left total knee arthroplasty using computer     navigation as assistance.  IMPLANTS:  DePuy PFC Sigma rotating platform knee with size 4 femur, size 4 MBT revision tibial tray, 12.5 mm polyethylene insert, 38 mm patellar button.  SURGEON:  Vanita Panda. Magnus Ivan, MD  ANESTHESIA:  General.  ANTIBIOTICS:  Three grams of IV Ancef.  TOURNIQUET TIME:  Less than 2 hours.  BLOOD LOSS:  50 mL.  COMPLICATIONS:  None.  INDICATIONS:  Mr. Curtis Mendez is a 46 year old morbidly obese individual, who in 2011, underwent a left unicompartmental knee arthroplasty.  He continues to experience swelling in that knee and pain.  This originally was performed in [place], [place].  I saw him in the office and he was noted to have significant patellofemoral arthritis as well. He has still a warm knee with an effusion, but no evidence of infection. Based on his radiographs, the femoral component could be loose, however, given his continued pain and patellofemoral arthritis as well as morbid obesity and his high level activity, I recommended he undergo revision total knee arthroplasty and removal of the unicompartment total prosthesis and revision to a total knee.  He understands the risks and benefits of this and he does wish to proceed with surgery.  PROCEDURE DESCRIPTION:  After informed consent was obtained, the appropriate left knee was  marked.  He was brought to the operating room and placed supine on the operating table.  General anesthesia was then obtained.  A Foley catheter was placed as well as a nonsterile tourniquet on his upper left thigh.  His left thigh down the foot was prepped and draped with DuraPrep and sterile drapes including a sterile stockinette.  A time-out was called and he was identified as correct patient and correct left knee.  I then used an Esmarch wrap out the leg and tourniquet was inflated to 350 mm of pressure.  A midline incision was then made over his old incision and carried proximally and distally. I dissected down to the knee joint and performed a medial parapatellar arthrotomy.  I retracted the patella laterally and exposed the knee. There was just a mild effusion that was cleared and showed no evidence of infection.  I then cleaned the osteophytes from the knee.  I found that he did have patellofemoral arthritis that was quite significant, but minimal lateral compartment arthritis.  It did appear that the femoral component was slightly loose and I was able to easily remove the femoral tray using an osteotome and remove the remnants of cement.  I then proceeded with computer navigation portion of the case.  Two temporary Steinmann pins were placed through 2 small stab incisions in the distal  third of the tibia and through the main incision, 2 Steinmann pins which were temporary pins were placed at the metaphyseal section of the femur.  Small globes were placed on these, so that we could create a computer generated model of the knee.  Based off the computer generated model, I then took my saw cut to take 10 mm off the high side of the tibia.  Once my tibial cut was made, I used soft tissue balancers to balance the knee in extension and flexion.  I then chose a size 4 femur and first made my distal femoral cut based on the computer plan at about 11 mm.  I then used extension block and  assured that the knee with a 12.5-mm block was completely balanced.  I then set my femoral rotation under navigation, placed my size 4 cutting block, and made my anterior and posterior cuts as well as my chamfer cuts.  I then made my femoral box cut.  Attention was then turned back to the tibia.  I chose a size 4 tibia based on my computer plan under direct visualization.  I decided that given his weight of being over around 320 pounds, to go with an MBT revision tray.  I drilled the post and keel for the revision tray and then placed that tray followed by the trial size 4 femur and 12.5 polyethylene insert.  I cut the patella for a 38-mm patellar button and drilled locks for this as well.  With all trial components in place, the knee was completely balanced.  It was straight appearing as well on flexion and extension, had good range of motion.  I then removed all trial components and copiously irrigated the knee with normal saline solution.  I then cemented the real DePuy MBT revision tray size 4 followed by the real size 4 femur and the real 12.5 mm polyethylene insert with a rotating platform and cemented the patellar button.  Once the cement had dried, I put the knee through a range of motion and it was nice and stable.  I took the tourniquet down and hemostasis was obtained with electrocautery plate.  I placed a medium Hemovac drain in the arthrotomy and closed the arthrotomy with a running 0 V-Loc suture followed by 0 Vicryl on the deep tissue, 2-0 Vicryl on subcutaneous tissue, and staples on the skin.  All Steinmann pins were also removed and I repaired those incisions with interrupted nylon suture.  A well- padded sterile dressing was applied and the knee was placed in a knee immobilizer.  He was awakened, extubated, and taken to the recovery in stable condition.  All final counts were correct and there were no complications noted.     Vanita Panda. Magnus Ivan,  M.D.     CYB/MEDQ  D:  05/02/2012  T:  05/02/2012  Job:  [id number]

## 2012-05-02 NOTE — Anesthesia Postprocedure Evaluation (Signed)
  Anesthesia Post-op Note  Patient: Curtis Mendez  Procedure(s) Performed: Procedure(s) (LRB): TOTAL KNEE REVISION (Left)  Patient Location: PACU  Anesthesia Type: General  Level of Consciousness: awake and alert   Airway and Oxygen Therapy: Patient Spontanous Breathing  Post-op Pain: mild  Post-op Assessment: Post-op Vital signs reviewed, Patient's Cardiovascular Status Stable, Respiratory Function Stable, Patent Airway and No signs of Nausea or vomiting  Post-op Vital Signs: stable  Complications: No apparent anesthesia complications

## 2012-05-02 NOTE — Progress Notes (Signed)
Utilization review completed.  

## 2012-05-03 LAB — CBC
Hemoglobin: 13 g/dL (ref 13.0–17.0)
MCH: 26.5 pg (ref 26.0–34.0)
MCHC: 33.9 g/dL (ref 30.0–36.0)
MCV: 78.4 fL (ref 78.0–100.0)
RBC: 4.9 MIL/uL (ref 4.22–5.81)

## 2012-05-03 LAB — GLUCOSE, CAPILLARY
Glucose-Capillary: 140 mg/dL — ABNORMAL HIGH (ref 70–99)
Glucose-Capillary: 127 mg/dL — ABNORMAL HIGH (ref 70–99)
Glucose-Capillary: 142 mg/dL — ABNORMAL HIGH (ref 70–99)
Glucose-Capillary: 144 mg/dL — ABNORMAL HIGH (ref 70–99)

## 2012-05-03 LAB — BASIC METABOLIC PANEL
CO2: 27 mEq/L (ref 19–32)
Calcium: 9 mg/dL (ref 8.4–10.5)
Creatinine, Ser: 0.69 mg/dL (ref 0.50–1.35)
GFR calc non Af Amer: >90 mL/min (ref >90)
Glucose, Bld: 137 mg/dL — ABNORMAL HIGH (ref 70–99)

## 2012-05-03 NOTE — Progress Notes (Signed)
Subjective: Pt stable - pain controlled - desires pca for 1 more day   Objective: Vital signs in last 24 hours: Temp:  [97.5 F (36.4 C)-99 F (37.2 C)] 98.2 F (36.8 C) (07/06 0622) Pulse Rate:  [81-111] 91  (07/06 0622) Resp:  [15-32] 20  (07/06 0754) BP: (126-170)/(67-97) 144/76 mmHg (07/06 0622) SpO2:  [2 %-100 %] 100 % (07/06 0754) Weight:  [144.697 kg (319 lb)] 144.697 kg (319 lb) (07/05 1200)  Intake/Output from previous day: 07/05 0701 - 07/06 0700 In: 4386.3 [P.O.:700; I.V.:3686.3] Out: 5595 [Urine:5225; Drains:320; Blood:50] Intake/Output this shift: Total I/O In: 200 [P.O.:200] Out: 475 [Urine:475]  Exam:  Neurovascular intact Sensation intact distally Intact pulses distally  Labs:  Basename 05/03/12 0457 04/30/12 1025  HGB 13.0 14.2    Basename 05/03/12 0457 04/30/12 1025  WBC 13.7* 9.4  RBC 4.90 5.26  HCT 38.4* 41.4  PLT 297 329    Basename 05/03/12 0457 04/30/12 1025  NA 135 136  K 3.8 4.1  CL 100 98  CO2 27 30  BUN 9 13  CREATININE 0.69 0.71  GLUCOSE 137* 112*  CALCIUM 9.0 10.0   No results found for this basename: LABPT:2,INR:2 in the last 72 hours  Assessment/Plan: Pt doing well - hv dced - mobilize with pt   DEAN,GREGORY SCOTT 05/03/2012, 9:35 AM

## 2012-05-03 NOTE — Evaluation (Signed)
Physical Therapy Evaluation Patient Details Name: Curtis Mendez MRN: [id number] DOB: [birth year] Today's Date: 05/03/2012 Time: 4098-1191 PT Time Calculation (min): 38 min  PT Assessment / Plan / Recommendation Clinical Impression  Pt with L TKR presents with decreaed L LE strength/ROM limiting functional mobilty    PT Assessment  Patient needs continued PT services    Follow Up Recommendations  Home health PT    Barriers to Discharge        Equipment Recommendations  Rolling walker with 5" wheels (Pt will require wide RW)    Recommendations for Other Services OT consult   Frequency 7X/week    Precautions / Restrictions Precautions Precautions: Knee Required Braces or Orthoses: Knee Immobilizer - Left Knee Immobilizer - Left: Discontinue once straight leg raise with < 10 degree lag Restrictions Weight Bearing Restrictions: No Other Position/Activity Restrictions: WBAT   Pertinent Vitals/Pain 3-4/10; PCA used, cold packs provided      Mobility  Bed Mobility Bed Mobility: Supine to Sit Supine to Sit: 4: Min assist Details for Bed Mobility Assistance: cues for sequence; assist for L LE Transfers Transfers: Sit to Stand;Stand to Sit Sit to Stand: 4: Min assist Stand to Sit: 4: Min assist Details for Transfer Assistance: cues for LE management and use of UES Ambulation/Gait Ambulation/Gait Assistance: 4: Min assist Ambulation Distance (Feet): 89 Feet Assistive device: Rolling walker Ambulation/Gait Assistance Details: cues for sequence, posture, stride length, and position from RW Gait Pattern: Step-to pattern    Exercises Total Joint Exercises Ankle Circles/Pumps: AROM;10 reps;Supine;Both Quad Sets: AROM;Both;10 reps;Supine Heel Slides: AAROM;10 reps;Left;Supine Straight Leg Raises: AAROM;Left;10 reps;Supine   PT Diagnosis: Difficulty walking  PT Problem List: Decreased strength;Decreased range of motion;Decreased activity tolerance;Decreased  mobility;Pain;Obesity;Decreased knowledge of use of DME;Decreased knowledge of precautions PT Treatment Interventions: DME instruction;Gait training;Stair training;Functional mobility training;Therapeutic activities;Therapeutic exercise;Patient/family education   PT Goals Acute Rehab PT Goals PT Goal Formulation: With patient Time For Goal Achievement: 05/06/12 Potential to Achieve Goals: Good Pt will go Supine/Side to Sit: with supervision PT Goal: Supine/Side to Sit - Progress: Goal set today Pt will go Sit to Supine/Side: with supervision PT Goal: Sit to Supine/Side - Progress: Goal set today Pt will go Sit to Stand: with supervision PT Goal: Sit to Stand - Progress: Goal set today Pt will go Stand to Sit: with supervision PT Goal: Stand to Sit - Progress: Goal set today Pt will Ambulate: >150 feet;with supervision;with rolling walker PT Goal: Ambulate - Progress: Goal set today Pt will Go Up / Down Stairs: 1-2 stairs;with min assist;with least restrictive assistive device PT Goal: Up/Down Stairs - Progress: Goal set today  Visit Information  Last PT Received On: 05/03/12 Assistance Needed: +1    Subjective Data  Subjective: I gave away all my equipment - I didn't think I would need it again Patient Stated Goal: Resume previous lifestyle with decreased pain   Prior Functioning  Home Living Lives With: Spouse Available Help at Discharge: Family Type of Home: House Home Access: Stairs to enter Secretary/administrator of Steps: 1 Entrance Stairs-Rails: None Home Layout: One level Prior Function Level of Independence: Independent Able to Take Stairs?: Yes Driving: Yes Vocation: Full time employment Communication Communication: No difficulties Dominant Hand: Right    Cognition  Overall Cognitive Status: Appears within functional limits for tasks assessed/performed Arousal/Alertness: Awake/alert Orientation Level: Appears intact for tasks assessed Behavior During  Session: [facility] for tasks performed    Extremity/Trunk Assessment Right Upper Extremity Assessment RUE ROM/Strength/Tone: [facility] for tasks assessed  Left Upper Extremity Assessment LUE ROM/Strength/Tone: WFL for tasks assessed Right Lower Extremity Assessment RLE ROM/Strength/Tone: [facility] for tasks assessed Left Lower Extremity Assessment LLE ROM/Strength/Tone: Deficits LLE ROM/Strength/Tone Deficits: Quads 2+/5; AAROm at knee -10 - 55   Balance    End of Session PT - End of Session Equipment Utilized During Treatment: Left knee immobilizer Activity Tolerance: Patient tolerated treatment well Patient left: in chair;with call bell/phone within reach;with family/visitor present Nurse Communication: Mobility status  GP     , 05/03/2012, 11:47 AM

## 2012-05-03 NOTE — Progress Notes (Signed)
Physical Therapy Treatment Patient Details Name: Curtis Mendez MRN: [id number] DOB: Jun 11, 1966 Today's Date: 05/03/2012 Time: 4132-4401 PT Time Calculation (min): 23 min  PT Assessment / Plan / Recommendation Comments on Treatment Session       Follow Up Recommendations  Home health PT    Barriers to Discharge        Equipment Recommendations  Rolling walker with 5" wheels    Recommendations for Other Services OT consult  Frequency 7X/week   Plan Discharge plan remains appropriate    Precautions / Restrictions Precautions Precautions: Knee Required Braces or Orthoses: Knee Immobilizer - Left Knee Immobilizer - Left: Discontinue once straight leg raise with < 10 degree lag Restrictions Weight Bearing Restrictions: No Other Position/Activity Restrictions: WBAT   Pertinent Vitals/Pain 7-8/10; MR received, PCA used, ice pack provided    Mobility  Bed Mobility Bed Mobility: Sit to Supine Sit to Supine: 4: Min assist Details for Bed Mobility Assistance: cues for sequence; assist for L LE Transfers Transfers: Sit to Stand;Stand to Sit Sit to Stand: 4: Min assist Stand to Sit: 4: Min assist Details for Transfer Assistance: cues for LE management and use of UES Ambulation/Gait Ambulation/Gait Assistance: 4: Min Environmental consultant (Feet): 170 Feet Assistive device: Rolling walker Ambulation/Gait Assistance Details: cues for posture, position from RW and stride length Gait Pattern: Step-to pattern    Exercises     PT Diagnosis:    PT Problem List:   PT Treatment Interventions:     PT Goals Acute Rehab PT Goals PT Goal Formulation: With patient Time For Goal Achievement: 05/06/12 Potential to Achieve Goals: Good Pt will go Supine/Side to Sit: with supervision PT Goal: Supine/Side to Sit - Progress: Goal set today Pt will go Sit to Supine/Side: with supervision PT Goal: Sit to Supine/Side - Progress: Goal set today Pt will go Sit to Stand: with  supervision PT Goal: Sit to Stand - Progress: Progressing toward goal Pt will go Stand to Sit: with supervision PT Goal: Stand to Sit - Progress: Progressing toward goal Pt will Ambulate: >150 feet;with supervision;with rolling walker PT Goal: Ambulate - Progress: Progressing toward goal Pt will Go Up / Down Stairs: 1-2 stairs;with min assist;with least restrictive assistive device PT Goal: Up/Down Stairs - Progress: Goal set today  Visit Information  Last PT Received On: 05/03/12 Assistance Needed: +1    Subjective Data  Subjective: It feels good to be moving Patient Stated Goal: Resume previous lifestyle with decreased pain   Cognition  Overall Cognitive Status: Appears within functional limits for tasks assessed/performed Arousal/Alertness: Awake/alert Orientation Level: Appears intact for tasks assessed Behavior During Session: [facility] for tasks performed    Balance     End of Session PT - End of Session Equipment Utilized During Treatment: Left knee immobilizer Activity Tolerance: Patient tolerated treatment well Patient left: in bed;with call bell/phone within reach;with nursing in room;with family/visitor present Nurse Communication: Mobility status CPM Left Knee CPM Left Knee: On   GP     , 05/03/2012, 3:23 PM

## 2012-05-04 LAB — CBC
MCH: 26.9 pg (ref 26.0–34.0)
MCHC: 33.6 g/dL (ref 30.0–36.0)
Platelets: 289 10*3/uL (ref 150–400)
RDW: 13.8 % (ref 11.5–15.5)

## 2012-05-04 LAB — GLUCOSE, CAPILLARY: Glucose-Capillary: 148 mg/dL — ABNORMAL HIGH (ref 70–99)

## 2012-05-04 MED ORDER — ASPIRIN 325 MG PO TBEC: 325.0000 mg | DELAYED_RELEASE_TABLET | Freq: Two times a day (BID) | ORAL | Status: AC

## 2012-05-04 MED ORDER — METHOCARBAMOL 500 MG PO TABS: 500.0000 mg | ORAL_TABLET | Freq: Four times a day (QID) | ORAL | Status: AC | PRN

## 2012-05-04 MED ORDER — OXYCODONE-ACETAMINOPHEN 5-325 MG PO TABS: 1.0000 | ORAL_TABLET | ORAL | Status: AC | PRN

## 2012-05-04 NOTE — Progress Notes (Signed)
Physical Therapy Treatment Patient Details Name: Curtis Mendez MRN: [id number] DOB: 09-Apr-1966 Today's Date: 05/04/2012 Time: 4098-1191 PT Time Calculation (min): 27 min  PT Assessment / Plan / Recommendation Comments on Treatment Session  Pt progressing very well with ambulation distance and demos good ROM with exercises.     Follow Up Recommendations  Home health PT    Barriers to Discharge        Equipment Recommendations  Rolling walker with 5" wheels (bari walker)    Recommendations for Other Services    Frequency 7X/week   Plan Discharge plan remains appropriate    Precautions / Restrictions Precautions Precautions: Knee Required Braces or Orthoses: Knee Immobilizer - Left Knee Immobilizer - Left: Discontinue once straight leg raise with < 10 degree lag Restrictions Weight Bearing Restrictions: No Other Position/Activity Restrictions: WBAT   Pertinent Vitals/Pain 3/10    Mobility  Bed Mobility Bed Mobility: Not assessed (Pt was already in recliner when PT arrived. ) Supine to Sit: 5: Supervision;HOB flat;With rails Transfers Transfers: Sit to Stand;Stand to Sit Sit to Stand: 5: Supervision;With upper extremity assist;With armrests;From chair/3-in-1 Stand to Sit: 5: Supervision;With upper extremity assist;With armrests;To chair/3-in-1 Details for Transfer Assistance: Supervision for safety with pt demonstrating proper UE use.  Ambulation/Gait Ambulation/Gait Assistance: 5: Supervision Ambulation Distance (Feet): 400 Feet Assistive device: Rolling walker Ambulation/Gait Assistance Details: Min cues for relaxed posture and not stepping too far into RW.  Gait Pattern: Step-to pattern    Exercises Total Joint Exercises Ankle Circles/Pumps: AROM;Both;20 reps Quad Sets: AROM;Both;10 reps Heel Slides: AAROM;Left;10 reps Hip ABduction/ADduction: AAROM;Left;10 reps Straight Leg Raises: AAROM;Left;10 reps   PT Diagnosis:    PT Problem List:   PT Treatment  Interventions:     PT Goals Acute Rehab PT Goals PT Goal Formulation: With patient Time For Goal Achievement: 05/06/12 Potential to Achieve Goals: Good Pt will go Sit to Stand: with supervision PT Goal: Sit to Stand - Progress: Met Pt will go Stand to Sit: with supervision PT Goal: Stand to Sit - Progress: Met Pt will Ambulate: >150 feet;with supervision;with rolling walker PT Goal: Ambulate - Progress: Met  Visit Information  Last PT Received On: 05/04/12 Assistance Needed: +1    Subjective Data  Subjective: You dont' know how good it feels to be up Patient Stated Goal: Resume previous lifestyle with decreased pain   Cognition  Overall Cognitive Status: Appears within functional limits for tasks assessed/performed Arousal/Alertness: Awake/alert Orientation Level: Appears intact for tasks assessed Behavior During Session: [facility] for tasks performed    Balance     End of Session PT - End of Session Equipment Utilized During Treatment: Left knee immobilizer Activity Tolerance: Patient tolerated treatment well Patient left: in chair;with call bell/phone within reach;with family/visitor present Nurse Communication: Mobility status   GP     Page, Meribeth Mattes 05/04/2012, 12:06 PM

## 2012-05-04 NOTE — Discharge Summary (Signed)
Patient ID: Curtis Mendez MRN: [id number] DOB/AGE: 02-26-1966 46 y.o.  Admit date: 05/02/2012 Discharge date: 05/04/2012  Admission Diagnoses:  Principal Problem:  *Loose total knee arthroplasty   Discharge Diagnoses:  Same  Past Medical History  Diagnosis Date  . 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 Hypertension   . Diabetes mellitus 04-30-12    Diabetes x2 yrs -oral meds only.  . Arthritis 04-30-12    Arthritis osteoarthritis-left knee    Surgeries: Procedure(s): TOTAL KNEE REVISION on 05/02/2012   Consultants:    Discharged Condition: Improved  Hospital Course: BRADEN CIMO is an 46 y.o. male who was admitted 05/02/2012 for operative treatment ofLoose total knee arthroplasty. Patient has severe unremitting pain that affects sleep, daily activities, and work/hobbies. After pre-op clearance the patient was taken to the operating room on 05/02/2012 and underwent  Procedure(s): TOTAL KNEE REVISION.    Patient was given perioperative antibiotics: Anti-infectives     Start     Dose/Rate Route Frequency Ordered Stop   05/02/12 1400   ceFAZolin (ANCEF) IVPB 1 g/50 mL premix        1 g 100 mL/hr over 30 Minutes Intravenous Every 6 hours 05/02/12 1201 05/02/12 2039   05/02/12 0530   ceFAZolin (ANCEF) 3 g in dextrose 5 % 50 mL IVPB        3 g 160 mL/hr over 30 Minutes Intravenous 60 min pre-op 05/02/12 0530 05/02/12 0736           Patient was given sequential compression devices, early ambulation, and chemoprophylaxis to prevent DVT.  Patient benefited maximally from hospital stay and there were no complications.    Recent vital signs: Patient Vitals for the past 24 hrs:  BP Temp Temp src Pulse Resp SpO2  05/04/12 0800 - - - - 16  100 %  05/04/12 0452 147/77 mmHg 99.4 F (37.4 C) Oral 96  18  98 %  05/04/12 0433 - - - - 18  98 %  05/11/2012 2352 - - - - 20  97 %  May 11, 2012 2143 138/50 mmHg  100.1 F (37.8 C) Oral 95  20  96 %  05/11/12 2052 - - - - 20  99 %  May 11, 2012 1600 - - - - 20  100 %  05/11/2012 1417 168/73 mmHg 97.6 F (36.4 C) - 89  20  100 %     Recent laboratory studies:  Basename 05/04/12 0446 05-11-12 0457  WBC 13.8* 13.7*  HGB 12.6* 13.0  HCT 37.5* 38.4*  PLT 289 297  NA -- 135  K -- 3.8  CL -- 100  CO2 -- 27  BUN -- 9  CREATININE -- 0.69  GLUCOSE -- 137*  INR -- --  CALCIUM -- 9.0     Discharge Medications:   Medication List  As of 05/04/2012  1:16 PM   TAKE these medications         amLODipine 10 MG tablet   Commonly known as: NORVASC   Take 10 mg by mouth daily.      aspirin 325 MG EC tablet   Take 1 tablet (325 mg total) by mouth 2 (two) times daily.      hydrochlorothiazide 25 MG tablet   Commonly known as: HYDRODIURIL   Take 25 mg by mouth daily.      metFORMIN 500 MG tablet   Commonly known as: GLUCOPHAGE   Take 500 mg by  mouth daily with breakfast.      methocarbamol 500 MG tablet   Commonly known as: ROBAXIN   Take 1 tablet (500 mg total) by mouth every 6 (six) hours as needed.      metoprolol succinate 100 MG 24 hr tablet   Commonly known as: TOPROL-XL   Take 100 mg by mouth daily. Take with or immediately following a meal.      oxyCODONE-acetaminophen 5-325 MG per tablet   Commonly known as: PERCOCET   Take 1-2 tablets by mouth every 4 (four) hours as needed for pain.            Diagnostic Studies: Dg Chest 2 View  04/30/2012  *RADIOLOGY REPORT*  Clinical Data: Failed left total knee replacement, preop  CHEST - 2 VIEW  Comparison: None.  Findings: The lungs are clear.  The heart is mildly enlarged.  No acute bony abnormality is seen.  IMPRESSION: No active lung disease.  Borderline to mild cardiomegaly.  Original Report Authenticated By: Juline Patch, M.D.   X-ray Knee Left Port  05/02/2012  *RADIOLOGY REPORT*  Clinical Data: Left total knee arthroplasty.  PORTABLE LEFT KNEE - 1-2 VIEW  Comparison: MRI 01/15/2011.   Findings: New uncomplicated left three-part total knee arthroplasty.  Expected postsurgical changes in the soft tissues. Surgical drain is present.  Anatomic alignment.  IMPRESSION: Uncomplicated new left three-part total knee arthroplasty.  Original Report Authenticated By: Andreas Newport, M.D.    Disposition: to home  Discharge Orders    Future Orders Please Complete By Expires   Diet - low sodium heart healthy      Call MD / Call 911      Comments:   If you experience chest pain or shortness of breath, CALL 911 and be transported to the hospital emergency room.  If you develope a fever above 101 F, pus (white drainage) or increased drainage or redness at the wound, or calf pain, call your surgeon's office.   Constipation Prevention      Comments:   Drink plenty of fluids.  Prune juice may be helpful.  You may use a stool softener, such as Colace (over the counter) 100 mg twice a day.  Use MiraLax (over the counter) for constipation as needed.   Increase activity slowly as tolerated      Discharge instructions      Comments:   Increase your activities as comfort allows. You can get your incision wet starting this coming Wed. Dry dressing daily as needed. Ice for swelling. Call [facility] [phone] with questions/concerns.   Discharge patient            Signed: Kathryne Hitch 05/04/2012, 1:16 PM

## 2012-05-04 NOTE — Progress Notes (Signed)
Discharged to family auto via wheelchair. Assessment unchanged from am. 

## 2012-05-04 NOTE — Evaluation (Signed)
Occupational Therapy Evaluation Patient Details Name: Curtis Mendez MRN: [id number] DOB: [birth year] Today's Date: 05/04/2012 Time: 4098-1191 OT Time Calculation (min): 20 min  OT Assessment / Plan / Recommendation Clinical Impression  This 46 y.o. male admitted for Lt. TKA. Pt. has been instructed in safety with BADLs and functional transfers.  He reports he will have assistance at home.  He currently requires min A for ADLs.  No DME or AE needs identified.  No further OT indicated, will sign off     OT Assessment  Patient does not need any further OT services    Follow Up Recommendations  No OT follow up;Supervision - Intermittent    Barriers to Discharge      Equipment Recommendations  None recommended by OT    Recommendations for Other Services    Frequency       Precautions / Restrictions Precautions Precautions: Knee Required Braces or Orthoses: Knee Immobilizer - Left Knee Immobilizer - Left: Discontinue once straight leg raise with < 10 degree lag Restrictions Weight Bearing Restrictions: No Other Position/Activity Restrictions: WBAT       ADL  Eating/Feeding: Simulated;Independent Where Assessed - Eating/Feeding: Chair Grooming: Simulated;Wash/dry hands;Teeth care;Supervision/safety Where Assessed - Grooming: Supported standing Upper Body Bathing: Simulated;Set up Where Assessed - Upper Body Bathing: Unsupported sitting Lower Body Bathing: Simulated;Minimal assistance Where Assessed - Lower Body Bathing: Supported sit to stand Upper Body Dressing: Simulated;Set up Where Assessed - Upper Body Dressing: Unsupported sitting Lower Body Dressing: Simulated;Performed;Moderate assistance Where Assessed - Lower Body Dressing: Supported sit to Pharmacist, hospital: Research scientist (life sciences) Method: Sit to Barista: Comfort height toilet;Grab bars Toileting - Architect and Hygiene:  Performed;Supervision/safety Where Assessed - Engineer, mining and Hygiene: Standing Equipment Used: Knee Immobilizer;Rolling walker Transfers/Ambulation Related to ADLs: supervision ADL Comments: Pt. reports he will have assistance with LB ADLs until he is able to access feet - able to bend forward to acces ankle on Lt.  Cautioned pt. not to twist or torque his knee when attempting to access foot.  He verbalized understanding.  Pt. also reports he will sponge bathe until he is able to tolerate FWB on Lt. LE in order to step over tub.  Pt. is not interested in AE or DME    OT Diagnosis:    OT Problem List:   OT Treatment Interventions:     OT Goals    Visit Information  Last OT Received On: 05/04/12 Assistance Needed: +1    Subjective Data  Subjective: "I'll be just fined when I go home" Patient Stated Goal: Did not state   Prior Functioning  Home Living Lives With: Spouse Available Help at Discharge: Family Type of Home: House Home Access: Stairs to enter Secretary/administrator of Steps: 1 Entrance Stairs-Rails: None Home Layout: One level Bathroom Shower/Tub: Tub/shower unit;Door [employer] Toilet: Handicapped height Bathroom Accessibility: Yes How Accessible: Accessible via walker Home Adaptive Equipment: None Prior Function Level of Independence: Independent Able to Take Stairs?: Yes Driving: Yes Vocation: Full time employment Communication Communication: No difficulties Dominant Hand: Right    Cognition  Overall Cognitive Status: Appears within functional limits for tasks assessed/performed Arousal/Alertness: Awake/alert Orientation Level: Appears intact for tasks assessed Behavior During Session: [facility] for tasks performed    Extremity/Trunk Assessment Right Upper Extremity Assessment RUE ROM/Strength/Tone: [facility] for tasks assessed Left Upper Extremity Assessment LUE ROM/Strength/Tone: WFL for tasks assessed   Mobility Bed Mobility Bed Mobility:  Sit to Supine Supine to Sit: 5: Supervision;HOB flat;With rails  Transfers Transfers: Sit to Stand;Stand to Sit Sit to Stand: 5: Supervision;With upper extremity assist;From bed;From toilet Stand to Sit: 5: Supervision;To chair/3-in-1;To toilet;With upper extremity assist   Exercise    Balance    End of Session OT - End of Session Equipment Utilized During Treatment: Left knee immobilizer Activity Tolerance: Patient tolerated treatment well Patient left: in chair;with call bell/phone within reach;with family/visitor present CPM Left Knee CPM Left Knee: Off  GO     ,  M 05/04/2012, 10:06 AM

## 2012-05-04 NOTE — Progress Notes (Signed)
Subjective: Pt stable - was oob x 2 yesterday - tol CPM   Objective: Vital signs in last 24 hours: Temp:  [97.6 F (36.4 C)-100.1 F (37.8 C)] 99.4 F (37.4 C) (07/07 0452) Pulse Rate:  [88-96] 96  (07/07 0452) Resp:  [2-20] 18  (07/07 0452) BP: (138-168)/(50-91) 147/77 mmHg (07/07 0452) SpO2:  [96 %-100 %] 98 % (07/07 0452)  Intake/Output from previous day: 07/06 0701 - 07/07 0700 In: 2681.2 [P.O.:920; I.V.:1761.2] Out: 2275 [Urine:2275] Intake/Output this shift:    Exam:  Neurovascular intact Sensation intact distally Intact pulses distally  Labs:  Basename 05/04/12 0446 05/03/12 0457  HGB 12.6* 13.0    Basename 05/04/12 0446 05/03/12 0457  WBC 13.8* 13.7*  RBC 4.68 4.90  HCT 37.5* 38.4*  PLT 289 297    Basename 05/03/12 0457  NA 135  K 3.8  CL 100  CO2 27  BUN 9  CREATININE 0.69  GLUCOSE 137*  CALCIUM 9.0   No results found for this basename: LABPT:2,INR:2 in the last 72 hours  Assessment/Plan: Pt improving his mobility - change to oral pain meds today   , SCOTT 05/04/2012, 9:23 AM

## 2012-05-05 ENCOUNTER — Encounter (HOSPITAL_COMMUNITY): Payer: Self-pay | Admitting: Orthopaedic Surgery

## 2012-05-20 ENCOUNTER — Ambulatory Visit: Payer: Medicare Other | Attending: Orthopaedic Surgery | Admitting: Physical Therapy

## 2012-05-20 DIAGNOSIS — R269 Unspecified abnormalities of gait and mobility: Secondary | ICD-10-CM | POA: Insufficient documentation

## 2012-05-20 DIAGNOSIS — IMO0001 Reserved for inherently not codable concepts without codable children: Secondary | ICD-10-CM | POA: Insufficient documentation

## 2012-05-20 DIAGNOSIS — M25669 Stiffness of unspecified knee, not elsewhere classified: Secondary | ICD-10-CM | POA: Insufficient documentation

## 2012-05-20 DIAGNOSIS — M25569 Pain in unspecified knee: Secondary | ICD-10-CM | POA: Insufficient documentation

## 2012-05-20 DIAGNOSIS — Z96659 Presence of unspecified artificial knee joint: Secondary | ICD-10-CM | POA: Insufficient documentation

## 2012-05-22 ENCOUNTER — Ambulatory Visit: Payer: Medicare Other | Admitting: Physical Therapy

## 2012-05-27 ENCOUNTER — Ambulatory Visit: Payer: Medicare Other | Admitting: Physical Therapy

## 2012-05-28 ENCOUNTER — Ambulatory Visit: Payer: Medicare Other | Admitting: Physical Therapy

## 2012-05-29 ENCOUNTER — Ambulatory Visit: Payer: Medicare Other | Attending: Orthopaedic Surgery | Admitting: Physical Therapy

## 2012-05-29 DIAGNOSIS — M25569 Pain in unspecified knee: Secondary | ICD-10-CM | POA: Insufficient documentation

## 2012-05-29 DIAGNOSIS — M25669 Stiffness of unspecified knee, not elsewhere classified: Secondary | ICD-10-CM | POA: Insufficient documentation

## 2012-05-29 DIAGNOSIS — IMO0001 Reserved for inherently not codable concepts without codable children: Secondary | ICD-10-CM | POA: Insufficient documentation

## 2012-05-29 DIAGNOSIS — Z96659 Presence of unspecified artificial knee joint: Secondary | ICD-10-CM | POA: Insufficient documentation

## 2012-05-29 DIAGNOSIS — R269 Unspecified abnormalities of gait and mobility: Secondary | ICD-10-CM | POA: Insufficient documentation

## 2012-06-03 ENCOUNTER — Ambulatory Visit: Payer: Medicare Other | Admitting: Physical Therapy

## 2012-06-04 ENCOUNTER — Ambulatory Visit: Payer: Medicare Other | Admitting: Physical Therapy

## 2012-06-06 ENCOUNTER — Ambulatory Visit: Payer: Medicare Other | Admitting: Physical Therapy

## 2012-06-11 ENCOUNTER — Ambulatory Visit: Payer: Medicare Other | Admitting: Physical Therapy

## 2012-06-12 ENCOUNTER — Ambulatory Visit: Payer: Medicare Other | Admitting: Physical Therapy

## 2012-06-17 ENCOUNTER — Encounter: Payer: Medicare Other | Admitting: Physical Therapy

## 2012-06-23 ENCOUNTER — Ambulatory Visit: Payer: Medicare Other | Admitting: Physical Therapy

## 2012-06-25 ENCOUNTER — Ambulatory Visit: Payer: Medicare Other | Admitting: Physical Therapy

## 2012-07-01 ENCOUNTER — Encounter: Payer: Medicare Other | Admitting: Physical Therapy

## 2012-07-03 ENCOUNTER — Encounter: Payer: Medicare Other | Admitting: Physical Therapy

## 2012-07-04 ENCOUNTER — Ambulatory Visit: Payer: Medicare Other | Attending: Orthopaedic Surgery | Admitting: Physical Therapy

## 2012-07-04 DIAGNOSIS — R269 Unspecified abnormalities of gait and mobility: Secondary | ICD-10-CM | POA: Insufficient documentation

## 2012-07-04 DIAGNOSIS — Z96659 Presence of unspecified artificial knee joint: Secondary | ICD-10-CM | POA: Insufficient documentation

## 2012-07-04 DIAGNOSIS — M25669 Stiffness of unspecified knee, not elsewhere classified: Secondary | ICD-10-CM | POA: Insufficient documentation

## 2012-07-04 DIAGNOSIS — IMO0001 Reserved for inherently not codable concepts without codable children: Secondary | ICD-10-CM | POA: Insufficient documentation

## 2012-07-04 DIAGNOSIS — M25569 Pain in unspecified knee: Secondary | ICD-10-CM | POA: Insufficient documentation

## 2012-07-07 ENCOUNTER — Ambulatory Visit: Payer: Medicare Other | Admitting: Physical Therapy

## 2012-07-09 ENCOUNTER — Ambulatory Visit: Payer: Medicare Other | Admitting: Physical Therapy

## 2012-07-10 ENCOUNTER — Encounter: Payer: Medicare Other | Admitting: Physical Therapy

## 2012-07-15 ENCOUNTER — Encounter: Payer: Medicare Other | Admitting: Physical Therapy

## 2012-07-22 ENCOUNTER — Ambulatory Visit: Payer: Medicare Other | Admitting: Physical Therapy

## 2012-07-24 ENCOUNTER — Ambulatory Visit: Payer: Medicare Other | Admitting: Physical Therapy

## 2013-02-27 ENCOUNTER — Emergency Department (HOSPITAL_COMMUNITY)
Admission: EM | Admit: 2013-02-27 | Discharge: 2013-02-27 | Disposition: A | Discharge status: Home / Self Care | Payer: Medicare Other | Attending: Emergency Medicine | Admitting: Emergency Medicine

## 2013-02-27 ENCOUNTER — Encounter (HOSPITAL_COMMUNITY): Payer: Self-pay | Admitting: *Deleted

## 2013-02-27 DIAGNOSIS — R42 Dizziness and giddiness: Secondary | ICD-10-CM | POA: Insufficient documentation

## 2013-02-27 DIAGNOSIS — I10 Essential (primary) hypertension: Secondary | ICD-10-CM | POA: Insufficient documentation

## 2013-02-27 DIAGNOSIS — E119 Type 2 diabetes mellitus without complications: Secondary | ICD-10-CM | POA: Insufficient documentation

## 2013-02-27 DIAGNOSIS — Z8739 Personal history of other diseases of the musculoskeletal system and connective tissue: Secondary | ICD-10-CM | POA: Insufficient documentation

## 2013-02-27 DIAGNOSIS — Z79899 Other long term (current) drug therapy: Secondary | ICD-10-CM | POA: Insufficient documentation

## 2013-02-27 LAB — POCT I-STAT, CHEM 8
Creatinine, Ser: 0.7 mg/dL (ref 0.50–1.35)
HCT: 46 % (ref 39.0–52.0)
Hemoglobin: 15.6 g/dL (ref 13.0–17.0)
Potassium: 3.7 mEq/L (ref 3.5–5.1)
Sodium: 139 mEq/L (ref 135–145)

## 2013-02-27 MED ORDER — MECLIZINE HCL 50 MG PO TABS: 25.0000 mg | ORAL_TABLET | Freq: Three times a day (TID) | ORAL | Status: DC | PRN

## 2013-02-27 NOTE — ED Notes (Signed)
Pt sts woke up this morning, felt lightheaded, and was sweating profusely. Pt sts hx of diabetes, this am when he checked it at home it was 178. Pt sts he feels better now, but just want to be checked.

## 2013-02-27 NOTE — ED Provider Notes (Signed)
History    CSN: [id number] Arrival date & time 02/27/13  1008 First MD Initiated Contact with Patient 02/27/13 1017     chief complaint: Dizziness  HPI Patient presents to the emergency room with complaints of lightheadedness and dizziness the patient states he woke up at about 3 AM. He went to stand up and he felt lightheaded and started sweating. The symptoms lasted maybe 10-15 minutes total. They completely resolved after he ate a cane the bar. The patient states he did check his blood sugar prior to eating the candy bar and his blood sugar was not actually low at that time. He denies any chest pain or shortness of breath. He did notice that when he looked down and moved his head around the symptoms seemed to worsen. He did feel like he was moving. Symptoms completely resolved. He woke up this morning and felt fine. Patient decided to come to emergency room just to make sure everything was okay. He denies any chest pain or shortness of breath. He denies any numbness or weakness or speech difficulty. He denies any headache or trouble with his coordination. Patient was able to walk this morning in the parking lot to get to the emergency room and states he felt completely fine. He has no complaints whatsoever at this time Past Medical History  Diagnosis Date  . 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 Hypertension   . Diabetes mellitus 04-30-12    Diabetes x2 yrs -oral meds only.  . Arthritis 04-30-12    Arthritis osteoarthritis-left knee    Past Surgical History  Procedure Laterality Date  . Joint replacement  04-30-12    LTKA 5'12-[facility]  . Total knee revision  05/02/2012    Procedure: TOTAL KNEE REVISION;  Surgeon: Kathryne Hitch, MD;  Location: [facility] ORS;  Service: Orthopedics;  Laterality: Left;  Revision Arthroplasty Left Knee    No family history on file.  History  Substance Use Topics  . Smoking status:  Never Smoker   . Smokeless tobacco: Not on file  . Alcohol Use: No      Review of Systems  All other systems reviewed and are negative.    Allergies  Vicodin and Lisinopril  Home Medications   Current Outpatient Rx  Name  Route  Sig  Dispense  Refill  . amLODipine (NORVASC) 10 MG tablet   Oral   Take 10 mg by mouth daily.         . hydrochlorothiazide (HYDRODIURIL) 25 MG tablet   Oral   Take 25 mg by mouth daily.         . metFORMIN (GLUCOPHAGE) 500 MG tablet   Oral   Take 500 mg by mouth daily with breakfast.         . metoprolol succinate (TOPROL-XL) 100 MG 24 hr tablet   Oral   Take 100 mg by mouth daily. Take with or immediately following a meal.         . meclizine (ANTIVERT) 50 MG tablet   Oral   Take 0.5 tablets (25 mg total) by mouth 3 (three) times daily as needed for dizziness or nausea.   21 tablet   0     BP 152/80  Pulse 75  Temp(Src) 98.6 F (37 C)  Resp 22  SpO2 99%  Physical Exam  Nursing note and vitals reviewed. Constitutional: He is oriented to person, place, and time. He appears well-developed and  well-nourished. No distress.  HENT:  Head: Normocephalic and atraumatic.  Right Ear: External ear normal.  Left Ear: External ear normal.  Mouth/Throat: Oropharynx is clear and moist.  Eyes: Conjunctivae are normal. Right eye exhibits no discharge. Left eye exhibits no discharge. No scleral icterus.  Neck: Neck supple. No tracheal deviation present.  Cardiovascular: Normal rate, regular rhythm and intact distal pulses.   Pulmonary/Chest: Effort normal and breath sounds normal. No stridor. No respiratory distress. He has no wheezes. He has no rales.  Abdominal: Soft. Bowel sounds are normal. He exhibits no distension. There is no tenderness. There is no rebound and no guarding.  Musculoskeletal: He exhibits no edema and no tenderness.  Neurological: He is alert and oriented to person, place, and time. He has normal strength. No  cranial nerve deficit ( no gross defecits noted) or sensory deficit. He exhibits normal muscle tone. He displays no seizure activity. Coordination normal.  No pronator drift bilateral upper extrem, able to hold both legs off bed for 5 seconds, sensation intact in all extremities, no visual field cuts, no left or right sided neglect; patient able to stand with his eyes closed and arms outstretched without difficulty  Skin: Skin is warm and dry. No rash noted.  Psychiatric: He has a normal mood and affect.    ED Course  Procedures (including critical care time) EKG Normal sinus rhythm rate 75 First degree AV block Normal ST T waves EKG is similar to prior EKG dated 04/30/2012, no significant changes Labs Reviewed  GLUCOSE, CAPILLARY - Abnormal; Notable for the following:    Glucose-Capillary 146 (*)    All other components within normal limits  POCT I-STAT, CHEM 8 - Abnormal; Notable for the following:    Glucose, Bld 148 (*)    All other components within normal limits   No results found.   1. Dizziness       MDM  The patient denies any symptoms currently. He feels well and has had no recurrent symptoms since very early this morning. The etiology of his symptoms are unclear. It is possible this could be related to vertigo but his symptoms have mostly resolved at this point.  Patient thought it was related to his blood sugar however her when he measured it was not low at this point it is been several hours since he had the symptoms. Patient has no complaints. I doubt TIA or stroke. I feel it is reasonable and follow up with his primary doctor. Warning signs and precautions were discussed        Celene Kras, MD 02/27/13 [id number]

## 2013-02-27 NOTE — ED Notes (Signed)
Pt sts he felt dizzy this morning and came in to make sure his blood sugar was good. Pt is very specific about what he wants [place] to do; pt refused to put gown on stating "I have no intention of staying here all day". Pt also sts if we need any blood work done on him we can "only use butterfly needle and try one time only or I will call administration to complain." Pt reassured we will do the best we can to provide quick, efficient care while keeping him safe.

## 2013-10-07 ENCOUNTER — Other Ambulatory Visit: Payer: Self-pay | Admitting: Orthopaedic Surgery

## 2013-10-07 DIAGNOSIS — M545 Low back pain: Secondary | ICD-10-CM

## 2013-10-26 ENCOUNTER — Ambulatory Visit
Admission: RE | Admit: 2013-10-26 | Discharge: 2013-10-26 | Disposition: A | Discharge status: Home / Self Care | Payer: Medicare Other | Source: Ambulatory Visit | Attending: Orthopaedic Surgery | Admitting: Orthopaedic Surgery

## 2013-10-26 DIAGNOSIS — M545 Low back pain: Secondary | ICD-10-CM

## 2014-01-19 DIAGNOSIS — K219 Gastro-esophageal reflux disease without esophagitis: Secondary | ICD-10-CM | POA: Diagnosis not present

## 2014-01-19 DIAGNOSIS — M25519 Pain in unspecified shoulder: Secondary | ICD-10-CM | POA: Diagnosis not present

## 2014-01-19 DIAGNOSIS — E119 Type 2 diabetes mellitus without complications: Secondary | ICD-10-CM | POA: Diagnosis not present

## 2014-01-19 DIAGNOSIS — I10 Essential (primary) hypertension: Secondary | ICD-10-CM | POA: Diagnosis not present

## 2014-02-22 DIAGNOSIS — M722 Plantar fascial fibromatosis: Secondary | ICD-10-CM | POA: Diagnosis not present

## 2014-03-15 DIAGNOSIS — E663 Overweight: Secondary | ICD-10-CM | POA: Diagnosis not present

## 2014-03-15 DIAGNOSIS — I10 Essential (primary) hypertension: Secondary | ICD-10-CM | POA: Diagnosis not present

## 2014-03-15 DIAGNOSIS — B351 Tinea unguium: Secondary | ICD-10-CM | POA: Diagnosis not present

## 2014-03-15 DIAGNOSIS — M25559 Pain in unspecified hip: Secondary | ICD-10-CM | POA: Diagnosis not present

## 2014-03-15 DIAGNOSIS — M722 Plantar fascial fibromatosis: Secondary | ICD-10-CM | POA: Diagnosis not present

## 2014-03-15 DIAGNOSIS — E119 Type 2 diabetes mellitus without complications: Secondary | ICD-10-CM | POA: Diagnosis not present

## 2014-04-05 DIAGNOSIS — I10 Essential (primary) hypertension: Secondary | ICD-10-CM | POA: Diagnosis not present

## 2014-04-05 DIAGNOSIS — E119 Type 2 diabetes mellitus without complications: Secondary | ICD-10-CM | POA: Diagnosis not present

## 2014-04-05 DIAGNOSIS — R6889 Other general symptoms and signs: Secondary | ICD-10-CM | POA: Diagnosis not present

## 2014-04-05 DIAGNOSIS — M722 Plantar fascial fibromatosis: Secondary | ICD-10-CM | POA: Diagnosis not present

## 2014-04-07 DIAGNOSIS — E119 Type 2 diabetes mellitus without complications: Secondary | ICD-10-CM | POA: Diagnosis not present

## 2014-05-20 DIAGNOSIS — E119 Type 2 diabetes mellitus without complications: Secondary | ICD-10-CM | POA: Diagnosis not present

## 2014-05-20 DIAGNOSIS — I10 Essential (primary) hypertension: Secondary | ICD-10-CM | POA: Diagnosis not present

## 2014-05-20 DIAGNOSIS — M1710 Unilateral primary osteoarthritis, unspecified knee: Secondary | ICD-10-CM | POA: Diagnosis not present

## 2014-05-20 DIAGNOSIS — K219 Gastro-esophageal reflux disease without esophagitis: Secondary | ICD-10-CM | POA: Diagnosis not present

## 2014-05-24 DIAGNOSIS — E119 Type 2 diabetes mellitus without complications: Secondary | ICD-10-CM | POA: Diagnosis not present

## 2014-05-24 DIAGNOSIS — M1710 Unilateral primary osteoarthritis, unspecified knee: Secondary | ICD-10-CM | POA: Diagnosis not present

## 2014-05-24 DIAGNOSIS — K219 Gastro-esophageal reflux disease without esophagitis: Secondary | ICD-10-CM | POA: Diagnosis not present

## 2014-05-24 DIAGNOSIS — I10 Essential (primary) hypertension: Secondary | ICD-10-CM | POA: Diagnosis not present

## 2014-06-11 ENCOUNTER — Encounter: Payer: Self-pay | Admitting: *Deleted

## 2014-06-11 ENCOUNTER — Encounter: Payer: Medicare Other | Attending: Internal Medicine | Admitting: *Deleted

## 2014-06-11 VITALS — Ht 66.0 in | Wt 316.4 lb

## 2014-06-11 DIAGNOSIS — E119 Type 2 diabetes mellitus without complications: Secondary | ICD-10-CM | POA: Diagnosis not present

## 2014-06-11 DIAGNOSIS — Z713 Dietary counseling and surveillance: Secondary | ICD-10-CM | POA: Diagnosis not present

## 2014-06-11 DIAGNOSIS — E669 Obesity, unspecified: Secondary | ICD-10-CM

## 2014-06-11 NOTE — Progress Notes (Signed)
  Medical Nutrition Therapy:  Appt start time: 0830 end time:  1000.  Assessment:  Primary concerns today: 06/11/14. States history of Diabetes for 4 years. A1c is currently 6.3%. Lives alone, shops and prepares own meals. Does not eat out often. Not working currently. 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 He has bicycle type elyptical with a seat at home and uses it for about 30 minutes as well as uses weights about 3 times a week. SMBG  every day in AM with reported range of 100-160 mg/dl. History of 3 knee replacements.   Preferred Learning Style:   Auditory  Visual  Hands on  Learning Readiness:   Ready  Change in progress  MEDICATIONS: see list, diabetes medication is Metformin   DIETARY INTAKE:  24-hr recall:  B ( AM): sandwich with bacon and egg, water, occasionally fresh fruit Snk ( AM): no  L ( PM): sandwich OR Chic Filet; grilled chicken sandwich usually, water or diet flavored tea Snk ( PM): not usually D ( PM): lean meat, vegetable and occasionally a starch, water Snk ( PM): no Beverages: water, diet flavored tea  Usual physical activity: He has elypitcal with a seat and weights about 3 times a week.   Estimated energy needs: 1400 calories 158 g carbohydrates 105 g protein 39 g fat  Progress Towards Goal(s):  In progress.   Nutritional Diagnosis:  NB-1.1 Food and nutrition-related knowledge deficit As related to diabetes and obesity.  As evidenced by no previous diabetes education and BMI of 51.2.    Intervention:  Nutrition counseling and diabetes education initiated. Discussed Carb Counting as method of portion control, reading food labels, and benefits of increased activity.  Plan:  Aim for 2-3 Carb Choices per meal (30-45 grams) +/- 1 either way  Aim for 0-1 Carbs per snack if hungry  Include protein in moderation with your meals and snacks Consider reading food labels for Total Carbohydrate and Fat Grams of foods Consider   increasing your activity level by adding some water and/or Arm Chair exercises daily as tolerated Continue checking BG at alternate times per day as directed by MD   Teaching Method Utilized: Visual, Auditory and Hands on  Handouts given during visit include: Living Well with Diabetes Carb Counting and Food Label handouts Meal Plan Card  Barriers to learning/adherence to lifestyle change: none  Demonstrated degree of understanding via:  Teach Back   Monitoring/Evaluation:  Dietary intake, exercise, reading food labels, and body weight in 6 week(s).

## 2014-06-11 NOTE — Patient Instructions (Signed)
Plan:  Aim for 2 Carb Choices per meal (30 grams) +/- 1 either way  Aim for 0-1 Carbs per snack if hungry  Include protein in moderation with your meals and snacks Consider reading food labels for Total Carbohydrate and Fat Grams of foods Consider  increasing your activity level by adding some water and/or Arm Chair exercises daily as tolerated Continue checking BG at alternate times per day as directed by MD

## 2014-06-21 DIAGNOSIS — M25559 Pain in unspecified hip: Secondary | ICD-10-CM | POA: Diagnosis not present

## 2014-06-21 DIAGNOSIS — M2550 Pain in unspecified joint: Secondary | ICD-10-CM | POA: Diagnosis not present

## 2014-06-21 DIAGNOSIS — R5381 Other malaise: Secondary | ICD-10-CM | POA: Diagnosis not present

## 2014-06-21 DIAGNOSIS — R5383 Other fatigue: Secondary | ICD-10-CM | POA: Diagnosis not present

## 2014-06-21 DIAGNOSIS — M25549 Pain in joints of unspecified hand: Secondary | ICD-10-CM | POA: Diagnosis not present

## 2014-06-21 DIAGNOSIS — Z111 Encounter for screening for respiratory tuberculosis: Secondary | ICD-10-CM | POA: Diagnosis not present

## 2014-06-21 DIAGNOSIS — Z79899 Other long term (current) drug therapy: Secondary | ICD-10-CM | POA: Diagnosis not present

## 2014-06-21 DIAGNOSIS — R6889 Other general symptoms and signs: Secondary | ICD-10-CM | POA: Diagnosis not present

## 2014-07-01 ENCOUNTER — Other Ambulatory Visit: Payer: Self-pay | Admitting: Rheumatology

## 2014-07-01 ENCOUNTER — Ambulatory Visit
Admission: RE | Admit: 2014-07-01 | Discharge: 2014-07-01 | Disposition: A | Discharge status: Home / Self Care | Payer: Medicare Other | Source: Ambulatory Visit | Attending: Rheumatology | Admitting: Rheumatology

## 2014-07-01 DIAGNOSIS — R7989 Other specified abnormal findings of blood chemistry: Secondary | ICD-10-CM | POA: Diagnosis not present

## 2014-07-01 DIAGNOSIS — R52 Pain, unspecified: Secondary | ICD-10-CM

## 2014-07-01 DIAGNOSIS — I517 Cardiomegaly: Secondary | ICD-10-CM | POA: Diagnosis not present

## 2014-07-01 DIAGNOSIS — R6889 Other general symptoms and signs: Secondary | ICD-10-CM | POA: Diagnosis not present

## 2014-07-21 DIAGNOSIS — M25549 Pain in joints of unspecified hand: Secondary | ICD-10-CM | POA: Diagnosis not present

## 2014-07-21 DIAGNOSIS — M545 Low back pain, unspecified: Secondary | ICD-10-CM | POA: Diagnosis not present

## 2014-07-21 DIAGNOSIS — R6889 Other general symptoms and signs: Secondary | ICD-10-CM | POA: Diagnosis not present

## 2014-07-22 ENCOUNTER — Ambulatory Visit: Payer: Medicare Other | Admitting: *Deleted

## 2014-08-02 DIAGNOSIS — Z713 Dietary counseling and surveillance: Secondary | ICD-10-CM | POA: Diagnosis not present

## 2014-08-02 DIAGNOSIS — R809 Proteinuria, unspecified: Secondary | ICD-10-CM | POA: Diagnosis not present

## 2014-08-02 DIAGNOSIS — E119 Type 2 diabetes mellitus without complications: Secondary | ICD-10-CM | POA: Diagnosis not present

## 2014-08-02 DIAGNOSIS — I10 Essential (primary) hypertension: Secondary | ICD-10-CM | POA: Diagnosis not present

## 2014-08-02 DIAGNOSIS — Z719 Counseling, unspecified: Secondary | ICD-10-CM | POA: Diagnosis not present

## 2014-08-02 DIAGNOSIS — M069 Rheumatoid arthritis, unspecified: Secondary | ICD-10-CM | POA: Diagnosis not present

## 2014-08-02 DIAGNOSIS — E291 Testicular hypofunction: Secondary | ICD-10-CM | POA: Diagnosis not present

## 2014-08-02 DIAGNOSIS — R61 Generalized hyperhidrosis: Secondary | ICD-10-CM | POA: Diagnosis not present

## 2014-08-26 DIAGNOSIS — E291 Testicular hypofunction: Secondary | ICD-10-CM | POA: Diagnosis not present

## 2014-08-26 DIAGNOSIS — E119 Type 2 diabetes mellitus without complications: Secondary | ICD-10-CM | POA: Diagnosis not present

## 2014-08-26 DIAGNOSIS — M069 Rheumatoid arthritis, unspecified: Secondary | ICD-10-CM | POA: Diagnosis not present

## 2014-08-26 DIAGNOSIS — I10 Essential (primary) hypertension: Secondary | ICD-10-CM | POA: Diagnosis not present

## 2014-11-25 DIAGNOSIS — E785 Hyperlipidemia, unspecified: Secondary | ICD-10-CM | POA: Diagnosis not present

## 2014-11-25 DIAGNOSIS — I10 Essential (primary) hypertension: Secondary | ICD-10-CM | POA: Diagnosis not present

## 2014-11-25 DIAGNOSIS — E119 Type 2 diabetes mellitus without complications: Secondary | ICD-10-CM | POA: Diagnosis not present

## 2014-12-09 DIAGNOSIS — E291 Testicular hypofunction: Secondary | ICD-10-CM | POA: Diagnosis not present

## 2014-12-09 DIAGNOSIS — E119 Type 2 diabetes mellitus without complications: Secondary | ICD-10-CM | POA: Diagnosis not present

## 2014-12-09 DIAGNOSIS — E785 Hyperlipidemia, unspecified: Secondary | ICD-10-CM | POA: Diagnosis not present

## 2014-12-09 DIAGNOSIS — I10 Essential (primary) hypertension: Secondary | ICD-10-CM | POA: Diagnosis not present

## 2014-12-21 DIAGNOSIS — Z01812 Encounter for preprocedural laboratory examination: Secondary | ICD-10-CM | POA: Diagnosis not present

## 2014-12-21 DIAGNOSIS — R638 Other symptoms and signs concerning food and fluid intake: Secondary | ICD-10-CM | POA: Diagnosis not present

## 2014-12-21 DIAGNOSIS — E119 Type 2 diabetes mellitus without complications: Secondary | ICD-10-CM | POA: Diagnosis not present

## 2014-12-21 DIAGNOSIS — I10 Essential (primary) hypertension: Secondary | ICD-10-CM | POA: Diagnosis not present

## 2015-01-05 DIAGNOSIS — E291 Testicular hypofunction: Secondary | ICD-10-CM | POA: Diagnosis not present

## 2015-01-12 DIAGNOSIS — E119 Type 2 diabetes mellitus without complications: Secondary | ICD-10-CM | POA: Diagnosis not present

## 2015-01-12 DIAGNOSIS — K219 Gastro-esophageal reflux disease without esophagitis: Secondary | ICD-10-CM | POA: Diagnosis not present

## 2015-01-12 DIAGNOSIS — Z01818 Encounter for other preprocedural examination: Secondary | ICD-10-CM | POA: Diagnosis not present

## 2015-01-24 DIAGNOSIS — A048 Other specified bacterial intestinal infections: Secondary | ICD-10-CM | POA: Diagnosis not present

## 2015-01-24 DIAGNOSIS — E119 Type 2 diabetes mellitus without complications: Secondary | ICD-10-CM | POA: Diagnosis not present

## 2015-02-02 DIAGNOSIS — E291 Testicular hypofunction: Secondary | ICD-10-CM | POA: Diagnosis not present

## 2015-02-14 DIAGNOSIS — E291 Testicular hypofunction: Secondary | ICD-10-CM | POA: Diagnosis not present

## 2015-02-17 DIAGNOSIS — E119 Type 2 diabetes mellitus without complications: Secondary | ICD-10-CM | POA: Diagnosis not present

## 2015-02-17 DIAGNOSIS — R0683 Snoring: Secondary | ICD-10-CM | POA: Diagnosis not present

## 2015-02-17 DIAGNOSIS — I10 Essential (primary) hypertension: Secondary | ICD-10-CM | POA: Diagnosis not present

## 2015-02-17 DIAGNOSIS — G4733 Obstructive sleep apnea (adult) (pediatric): Secondary | ICD-10-CM | POA: Diagnosis not present

## 2015-02-25 DIAGNOSIS — G4733 Obstructive sleep apnea (adult) (pediatric): Secondary | ICD-10-CM | POA: Diagnosis not present

## 2015-03-02 DIAGNOSIS — E291 Testicular hypofunction: Secondary | ICD-10-CM | POA: Diagnosis not present

## 2015-03-17 DIAGNOSIS — E291 Testicular hypofunction: Secondary | ICD-10-CM | POA: Diagnosis not present

## 2015-03-17 DIAGNOSIS — I10 Essential (primary) hypertension: Secondary | ICD-10-CM | POA: Diagnosis not present

## 2015-03-17 DIAGNOSIS — E785 Hyperlipidemia, unspecified: Secondary | ICD-10-CM | POA: Diagnosis not present

## 2015-03-17 DIAGNOSIS — R7989 Other specified abnormal findings of blood chemistry: Secondary | ICD-10-CM | POA: Diagnosis not present

## 2015-03-17 DIAGNOSIS — E119 Type 2 diabetes mellitus without complications: Secondary | ICD-10-CM | POA: Diagnosis not present

## 2015-03-30 DIAGNOSIS — E291 Testicular hypofunction: Secondary | ICD-10-CM | POA: Diagnosis not present

## 2015-04-11 DIAGNOSIS — I10 Essential (primary) hypertension: Secondary | ICD-10-CM | POA: Diagnosis not present

## 2015-04-11 DIAGNOSIS — E139 Other specified diabetes mellitus without complications: Secondary | ICD-10-CM | POA: Diagnosis not present

## 2015-04-11 DIAGNOSIS — Z6841 Body Mass Index (BMI) 40.0 and over, adult: Secondary | ICD-10-CM | POA: Diagnosis not present

## 2015-04-11 DIAGNOSIS — E669 Obesity, unspecified: Secondary | ICD-10-CM | POA: Diagnosis not present

## 2015-04-11 DIAGNOSIS — R946 Abnormal results of thyroid function studies: Secondary | ICD-10-CM | POA: Diagnosis not present

## 2015-05-03 DIAGNOSIS — E119 Type 2 diabetes mellitus without complications: Secondary | ICD-10-CM | POA: Diagnosis not present

## 2015-05-03 DIAGNOSIS — I10 Essential (primary) hypertension: Secondary | ICD-10-CM | POA: Diagnosis not present

## 2015-05-03 DIAGNOSIS — G4733 Obstructive sleep apnea (adult) (pediatric): Secondary | ICD-10-CM | POA: Diagnosis not present

## 2015-05-06 DIAGNOSIS — Z01810 Encounter for preprocedural cardiovascular examination: Secondary | ICD-10-CM | POA: Diagnosis not present

## 2015-05-06 DIAGNOSIS — E119 Type 2 diabetes mellitus without complications: Secondary | ICD-10-CM | POA: Diagnosis not present

## 2015-05-06 DIAGNOSIS — R9431 Abnormal electrocardiogram [ECG] [EKG]: Secondary | ICD-10-CM | POA: Diagnosis not present

## 2015-05-06 DIAGNOSIS — I10 Essential (primary) hypertension: Secondary | ICD-10-CM | POA: Diagnosis not present

## 2015-05-09 DIAGNOSIS — Z01810 Encounter for preprocedural cardiovascular examination: Secondary | ICD-10-CM | POA: Diagnosis not present

## 2015-05-09 DIAGNOSIS — I10 Essential (primary) hypertension: Secondary | ICD-10-CM | POA: Diagnosis not present

## 2015-05-09 DIAGNOSIS — R9431 Abnormal electrocardiogram [ECG] [EKG]: Secondary | ICD-10-CM | POA: Diagnosis not present

## 2015-05-09 DIAGNOSIS — E119 Type 2 diabetes mellitus without complications: Secondary | ICD-10-CM | POA: Diagnosis not present

## 2015-05-11 DIAGNOSIS — I10 Essential (primary) hypertension: Secondary | ICD-10-CM | POA: Diagnosis not present

## 2015-05-11 DIAGNOSIS — Z6841 Body Mass Index (BMI) 40.0 and over, adult: Secondary | ICD-10-CM | POA: Diagnosis not present

## 2015-05-11 DIAGNOSIS — E669 Obesity, unspecified: Secondary | ICD-10-CM | POA: Diagnosis not present

## 2015-05-11 DIAGNOSIS — E139 Other specified diabetes mellitus without complications: Secondary | ICD-10-CM | POA: Diagnosis not present

## 2015-05-11 DIAGNOSIS — R600 Localized edema: Secondary | ICD-10-CM | POA: Diagnosis not present

## 2015-06-14 DIAGNOSIS — I10 Essential (primary) hypertension: Secondary | ICD-10-CM | POA: Diagnosis not present

## 2015-06-14 DIAGNOSIS — Z01818 Encounter for other preprocedural examination: Secondary | ICD-10-CM | POA: Diagnosis not present

## 2015-06-14 DIAGNOSIS — Z5181 Encounter for therapeutic drug level monitoring: Secondary | ICD-10-CM | POA: Diagnosis not present

## 2015-06-14 DIAGNOSIS — Z79899 Other long term (current) drug therapy: Secondary | ICD-10-CM | POA: Diagnosis not present

## 2015-06-14 DIAGNOSIS — E119 Type 2 diabetes mellitus without complications: Secondary | ICD-10-CM | POA: Diagnosis not present

## 2015-06-14 DIAGNOSIS — G4733 Obstructive sleep apnea (adult) (pediatric): Secondary | ICD-10-CM | POA: Diagnosis not present

## 2015-06-20 DIAGNOSIS — E119 Type 2 diabetes mellitus without complications: Secondary | ICD-10-CM | POA: Diagnosis not present

## 2015-06-20 DIAGNOSIS — Z6841 Body Mass Index (BMI) 40.0 and over, adult: Secondary | ICD-10-CM | POA: Diagnosis not present

## 2015-06-20 DIAGNOSIS — K2950 Unspecified chronic gastritis without bleeding: Secondary | ICD-10-CM | POA: Diagnosis not present

## 2015-06-20 DIAGNOSIS — G4733 Obstructive sleep apnea (adult) (pediatric): Secondary | ICD-10-CM | POA: Diagnosis not present

## 2015-06-20 DIAGNOSIS — I10 Essential (primary) hypertension: Secondary | ICD-10-CM | POA: Diagnosis not present

## 2015-06-20 DIAGNOSIS — Z9884 Bariatric surgery status: Secondary | ICD-10-CM | POA: Diagnosis not present

## 2015-06-20 DIAGNOSIS — K449 Diaphragmatic hernia without obstruction or gangrene: Secondary | ICD-10-CM | POA: Diagnosis present

## 2015-07-12 DIAGNOSIS — E119 Type 2 diabetes mellitus without complications: Secondary | ICD-10-CM | POA: Diagnosis not present

## 2015-07-12 DIAGNOSIS — Z713 Dietary counseling and surveillance: Secondary | ICD-10-CM | POA: Diagnosis not present

## 2015-08-02 DIAGNOSIS — Z6841 Body Mass Index (BMI) 40.0 and over, adult: Secondary | ICD-10-CM | POA: Diagnosis not present

## 2015-08-02 DIAGNOSIS — Z23 Encounter for immunization: Secondary | ICD-10-CM | POA: Diagnosis not present

## 2015-08-02 DIAGNOSIS — I10 Essential (primary) hypertension: Secondary | ICD-10-CM | POA: Diagnosis not present

## 2015-08-02 DIAGNOSIS — E139 Other specified diabetes mellitus without complications: Secondary | ICD-10-CM | POA: Diagnosis not present

## 2015-08-25 DIAGNOSIS — I10 Essential (primary) hypertension: Secondary | ICD-10-CM | POA: Diagnosis not present

## 2015-09-06 DIAGNOSIS — Z5181 Encounter for therapeutic drug level monitoring: Secondary | ICD-10-CM | POA: Diagnosis not present

## 2015-09-06 DIAGNOSIS — Z79899 Other long term (current) drug therapy: Secondary | ICD-10-CM | POA: Diagnosis not present

## 2015-09-26 DIAGNOSIS — I10 Essential (primary) hypertension: Secondary | ICD-10-CM | POA: Diagnosis not present

## 2015-09-26 DIAGNOSIS — E139 Other specified diabetes mellitus without complications: Secondary | ICD-10-CM | POA: Diagnosis not present

## 2015-09-26 DIAGNOSIS — Z6841 Body Mass Index (BMI) 40.0 and over, adult: Secondary | ICD-10-CM | POA: Diagnosis not present

## 2015-09-26 DIAGNOSIS — E669 Obesity, unspecified: Secondary | ICD-10-CM | POA: Diagnosis not present

## 2015-12-13 DIAGNOSIS — I10 Essential (primary) hypertension: Secondary | ICD-10-CM | POA: Diagnosis not present

## 2015-12-13 DIAGNOSIS — M069 Rheumatoid arthritis, unspecified: Secondary | ICD-10-CM | POA: Diagnosis not present

## 2015-12-13 DIAGNOSIS — Z1389 Encounter for screening for other disorder: Secondary | ICD-10-CM | POA: Diagnosis not present

## 2015-12-13 DIAGNOSIS — Z6839 Body mass index (BMI) 39.0-39.9, adult: Secondary | ICD-10-CM | POA: Diagnosis not present

## 2015-12-13 DIAGNOSIS — Z0000 Encounter for general adult medical examination without abnormal findings: Secondary | ICD-10-CM | POA: Diagnosis not present

## 2016-01-12 DIAGNOSIS — Z5181 Encounter for therapeutic drug level monitoring: Secondary | ICD-10-CM | POA: Diagnosis not present

## 2016-01-12 DIAGNOSIS — Z9884 Bariatric surgery status: Secondary | ICD-10-CM | POA: Diagnosis not present

## 2016-01-12 DIAGNOSIS — K912 Postsurgical malabsorption, not elsewhere classified: Secondary | ICD-10-CM | POA: Diagnosis not present

## 2016-01-12 DIAGNOSIS — R638 Other symptoms and signs concerning food and fluid intake: Secondary | ICD-10-CM | POA: Diagnosis not present

## 2016-01-12 DIAGNOSIS — Z79891 Long term (current) use of opiate analgesic: Secondary | ICD-10-CM | POA: Diagnosis not present

## 2016-05-29 DIAGNOSIS — E669 Obesity, unspecified: Secondary | ICD-10-CM | POA: Diagnosis not present

## 2016-05-29 DIAGNOSIS — Z6837 Body mass index (BMI) 37.0-37.9, adult: Secondary | ICD-10-CM | POA: Diagnosis not present

## 2016-05-29 DIAGNOSIS — I10 Essential (primary) hypertension: Secondary | ICD-10-CM | POA: Diagnosis not present

## 2016-06-19 IMAGING — CR DG CHEST 2V
2 series · 2 of 2 positions shown · non-contrast
Comparison: PA and lateral chest 04/30/2012.

CLINICAL DATA: Questions sarcoidosis.  Elevated ACE.

EXAM:
CHEST  2 VIEW

[w chest pa *]
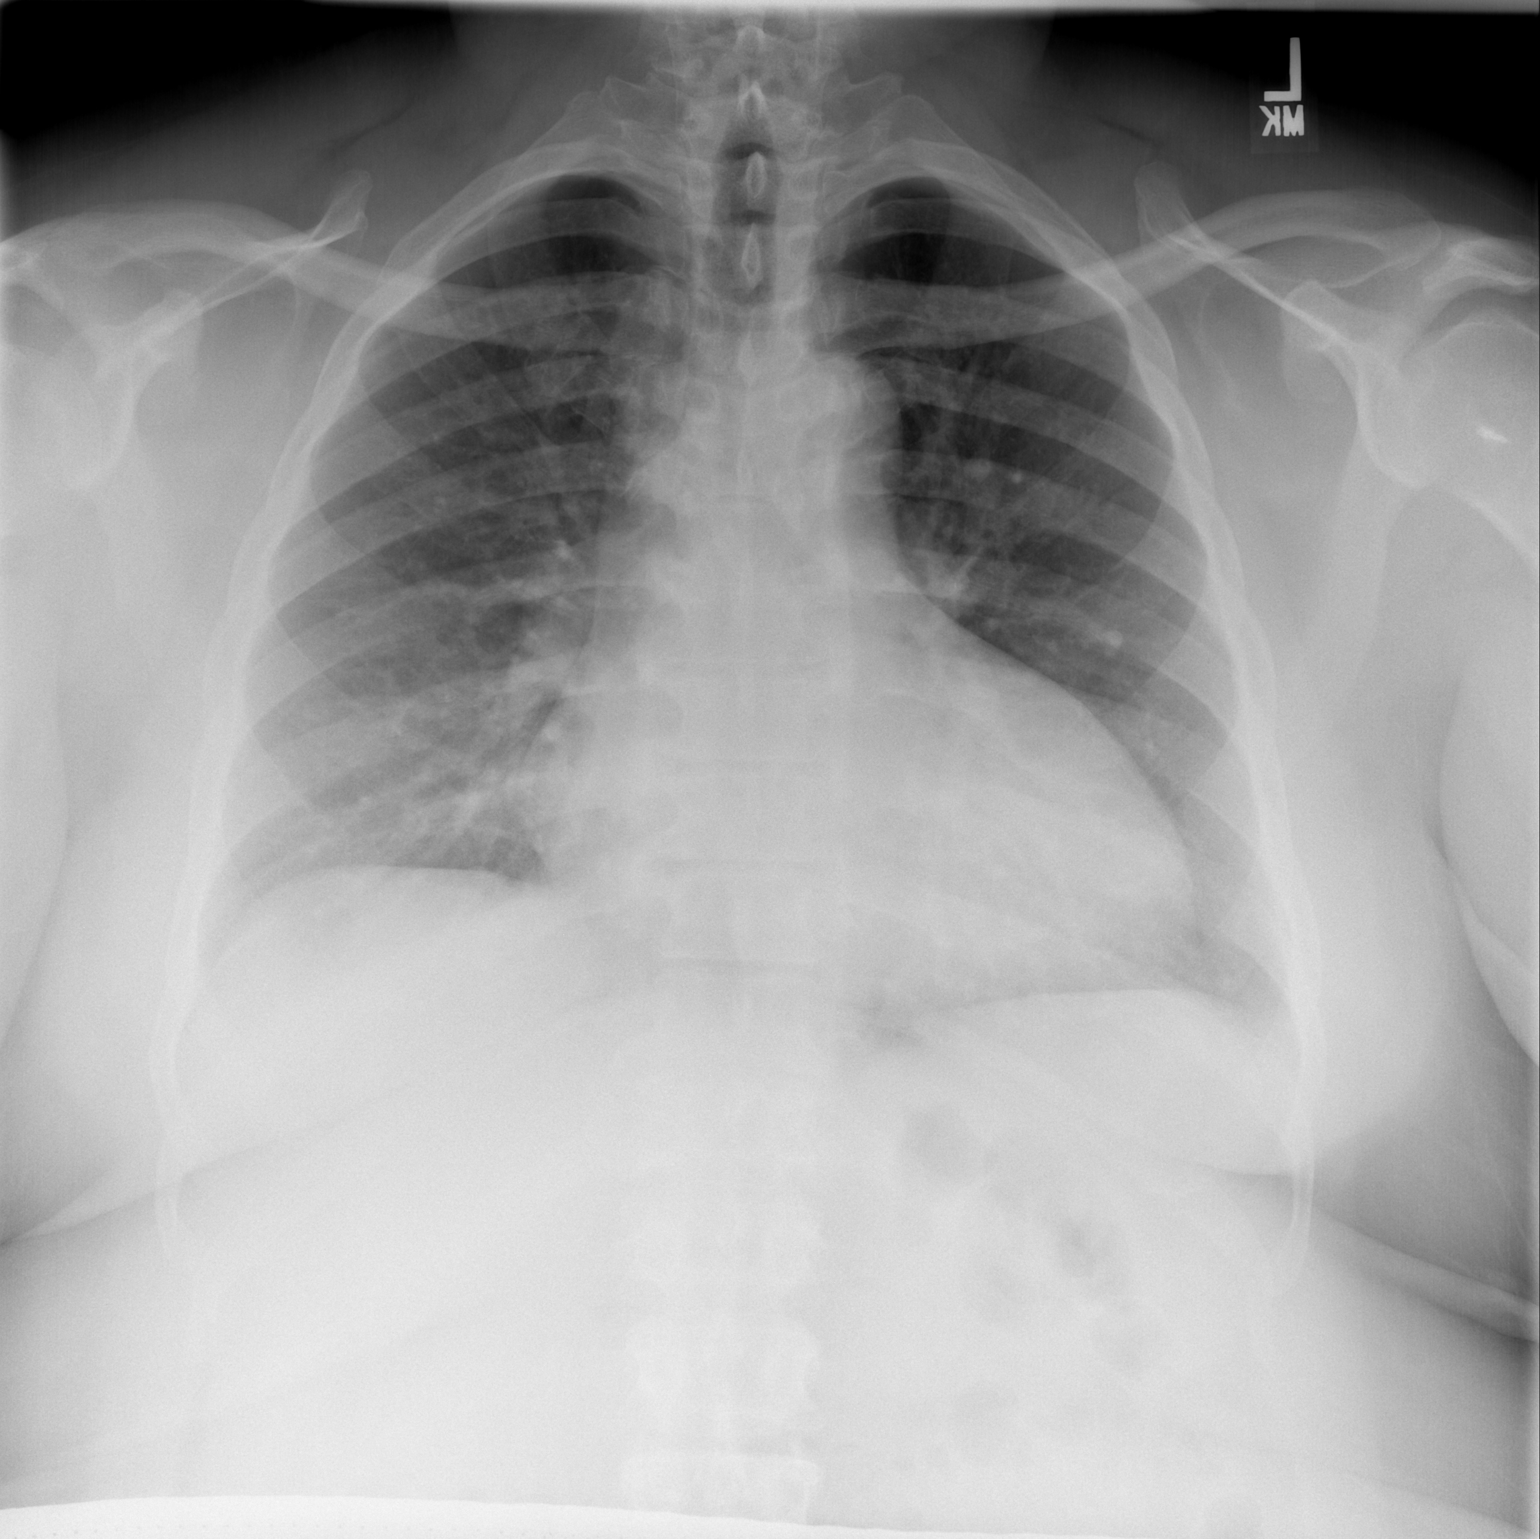

[w chest lat *]
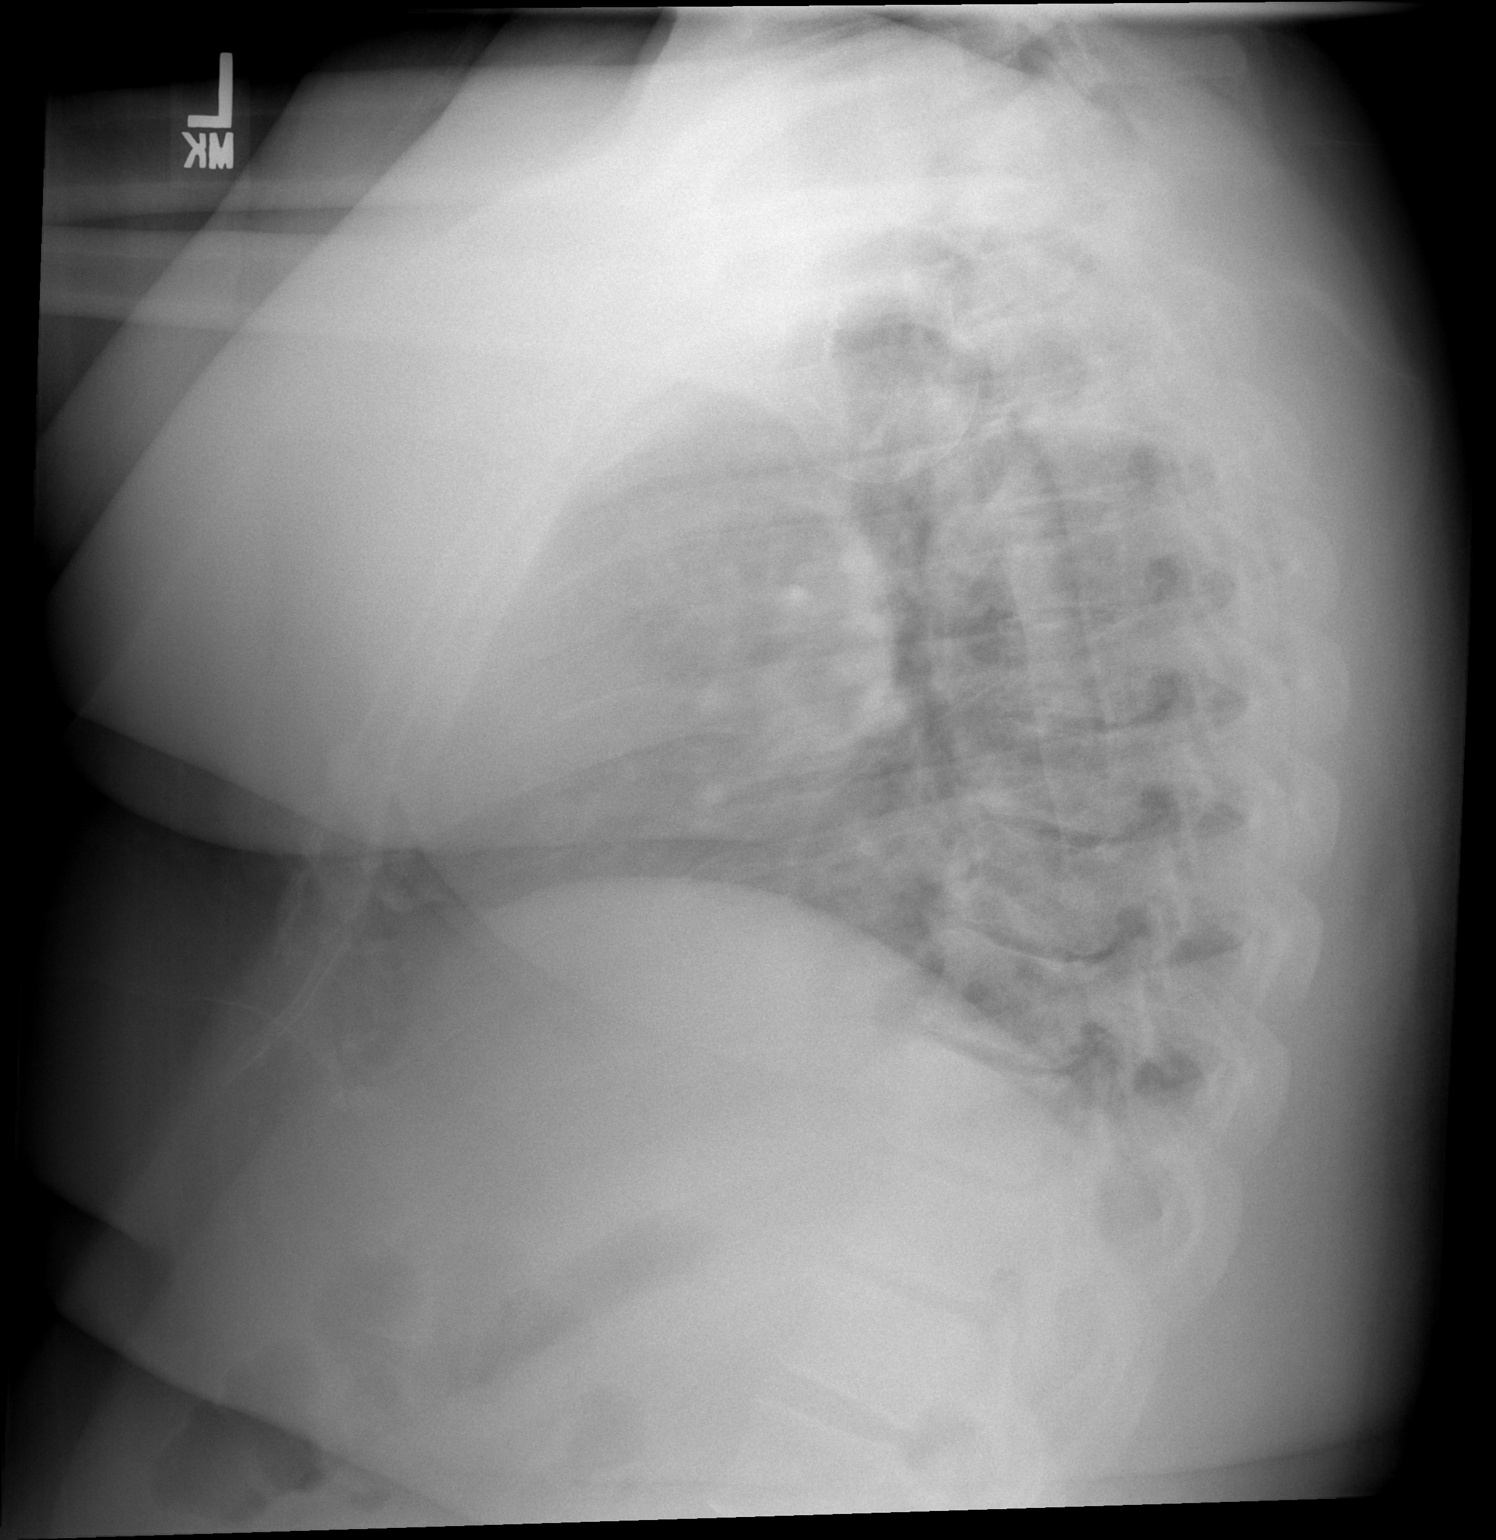

[2 of 2 positions shown; findings below may reference images not displayed]

FINDINGS: There is cardiomegaly without edema. No evidence of mediastinal
lymphadenopathy is seen. The lungs are clear. No pneumothorax or
pleural effusion. Postoperative change left shoulder noted.
IMPRESSION: Cardiomegaly without acute disease. No plain film evidence of
sarcoidosis.

## 2016-08-15 ENCOUNTER — Ambulatory Visit (INDEPENDENT_AMBULATORY_CARE_PROVIDER_SITE_OTHER): Payer: Medicare Other | Admitting: Orthopaedic Surgery

## 2016-08-15 ENCOUNTER — Encounter (INDEPENDENT_AMBULATORY_CARE_PROVIDER_SITE_OTHER): Payer: Self-pay

## 2016-08-15 DIAGNOSIS — M25552 Pain in left hip: Secondary | ICD-10-CM

## 2016-08-15 DIAGNOSIS — M25551 Pain in right hip: Secondary | ICD-10-CM

## 2016-09-12 ENCOUNTER — Ambulatory Visit (INDEPENDENT_AMBULATORY_CARE_PROVIDER_SITE_OTHER): Payer: Medicare Other | Admitting: Orthopaedic Surgery

## 2016-09-19 ENCOUNTER — Encounter: Payer: Self-pay | Admitting: *Deleted

## 2016-09-19 DIAGNOSIS — Z96652 Presence of left artificial knee joint: Secondary | ICD-10-CM

## 2016-09-19 DIAGNOSIS — M1612 Unilateral primary osteoarthritis, left hip: Secondary | ICD-10-CM

## 2016-09-19 DIAGNOSIS — M069 Rheumatoid arthritis, unspecified: Secondary | ICD-10-CM

## 2016-09-19 HISTORY — DX: Rheumatoid arthritis, unspecified: M06.9

## 2016-09-19 HISTORY — DX: Unilateral primary osteoarthritis, left hip: M16.12

## 2016-09-19 HISTORY — DX: Presence of left artificial knee joint: Z96.652

## 2016-09-20 DIAGNOSIS — E119 Type 2 diabetes mellitus without complications: Secondary | ICD-10-CM | POA: Insufficient documentation

## 2016-09-20 DIAGNOSIS — Z9889 Other specified postprocedural states: Secondary | ICD-10-CM | POA: Insufficient documentation

## 2016-09-20 DIAGNOSIS — Z79899 Other long term (current) drug therapy: Secondary | ICD-10-CM | POA: Insufficient documentation

## 2016-09-20 NOTE — Progress Notes (Signed)
Office Visit Note  Patient: Curtis Mendez             Date of Birth: 24-May-1966           MRN: [id number]             PCP: Katy Apo, MD Referring: Fleet Contras, MD Visit Date: 09/24/2016 Occupation: Drug rep    Subjective:   Pain in bilateral hips  History of Present Illness: Curtis Mendez is a 50 y.o. right-handed male with sero positive rheumatoid arthritis. According to patient after the last visit he tried a dose of Plaquenil but discontinuingit due to nausea . He is not taking any medications for rheumatoid arthritis at this time. He continues to have discomfort in his bilateral hands bilateral hips and his knee joints. He denies any joint swelling.  Activities of Daily Living:  Patient reports morning stiffness for 30 minutes.   Patient Reports nocturnal pain.  Difficulty dressing/grooming: Denies Difficulty climbing stairs: Denies Difficulty getting out of chair: Reports Difficulty using hands for taps, buttons, cutlery, and/or writing: Denies   Review of Systems  Constitutional: Negative for fatigue, night sweats and weakness ( ).  HENT: Negative for mouth sores, mouth dryness and nose dryness.   Eyes: Negative for redness and dryness.  Respiratory: Negative for shortness of breath and difficulty breathing.   Cardiovascular: Negative for chest pain, palpitations, hypertension, irregular heartbeat and swelling in legs/feet.  Gastrointestinal: Negative for constipation and diarrhea.  Endocrine: Negative for increased urination.  Musculoskeletal: Positive for arthralgias, joint pain and morning stiffness. Negative for joint swelling, myalgias, muscle weakness, muscle tenderness and myalgias.  Skin: Negative for color change, rash, hair loss, nodules/bumps, skin tightness, ulcers and sensitivity to sunlight.  Allergic/Immunologic: Negative for susceptible to infections.  Neurological: Negative for dizziness, fainting, memory loss and night sweats.    Hematological: Negative for swollen glands.  Psychiatric/Behavioral: Negative for depressed mood and sleep disturbance. The patient is not nervous/anxious.     PMFS History:  Patient Active Problem List   Diagnosis Date Noted  . High-risk medication use 09/20/2016  . H/O repair of left rotator cuff 09/20/2016  . Type 2 diabetes mellitus without complication, without long-term current use of insulin (HCC) 09/20/2016  . Rheumatoid arthritis (HCC) 09/19/2016  . H/O total knee replacement, left 09/19/2016  . Osteoarthritis of left hip 09/19/2016  . Loose total knee arthroplasty (HCC) 05/02/2012  . OTHER DISEASES OF VOCAL CORDS 08/01/2010  . STUTTERING 07/13/2010  . WEAKNESS 07/13/2010  . FLANK PAIN, LEFT 06/26/2010  . ERECTILE DYSFUNCTION, SECONDARY TO MEDICATION 03/01/2010  . OTHER EYE PROBLEMS 01/10/2010  . ELBOW PAIN, RIGHT 10/12/2009  . DIABETES MELLITUS 11/02/2008  . EDEMA 10/28/2008  . Morbid (severe) obesity due to excess calories (HCC) 08/09/2008  . PAIN IN JOINT, SITE UNSPECIFIED 04/09/2008  . HIP PAIN, LEFT 04/09/2008  . CHRONIC TENSION TYPE HEADACHE 10/20/2007  . 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 HERNIATED CERVICAL DISC 07/28/2007  . CARPAL TUNNEL SYNDROME, LEFT 07/04/2007  . HYPERTENSION, BENIGN ESSENTIAL 07/04/2007  . Pain in joint, lower leg 07/04/2007    Past Medical History:  Diagnosis Date  . Arthritis 04-30-12   Arthritis osteoarthritis-left knee  . Complication of anesthesia 04-30-12   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  . Diabetes mellitus 04-30-12   Diabetes x2 yrs -oral meds only.  . H/O total knee replacement, left 09/19/2016  . Hypertension   . Osteoarthritis of left hip 09/19/2016   Mild  .  Rheumatoid arthritis (HCC) 09/19/2016   +RF 344, CCP negative, ANA negative    Family History  Problem Relation Age of Onset  . Rheum arthritis Father   . Hypertension Father    Past Surgical History:   Procedure Laterality Date  . CARPAL TUNNEL RELEASE Left   . HIP SURGERY Left    Left Bursa Removed  . JOINT REPLACEMENT  04-30-12   LTKA 5'12-[facility]  . ROTATOR CUFF REPAIR Left   . TOTAL KNEE REVISION  05/02/2012   Procedure: TOTAL KNEE REVISION;  Surgeon: Kathryne Hitch, MD;  Location: [facility];  Service: Orthopedics;  Laterality: Left;  Revision Arthroplasty Left Knee   Social History   Social History Narrative  . No narrative on file     Objective: Vital Signs: BP (!) 147/95 (BP Location: Right Arm, Patient Position: Sitting, Cuff Size: Large)   Pulse 61   Resp 13   Ht 5\' 7"  (1.702 m)   Wt 217 lb (98.4 kg)   BMI 33.99 kg/m    Physical Exam  Constitutional: He is oriented to person, place, and time. He appears well-developed and well-nourished.  HENT:  Head: Normocephalic and atraumatic.  Eyes: Conjunctivae and EOM are normal. Pupils are equal, round, and reactive to light.  Neck: Normal range of motion. Neck supple.  Cardiovascular: Normal rate, regular rhythm and normal heart sounds.   Pulmonary/Chest: Effort normal and breath sounds normal.  Abdominal: Soft. Bowel sounds are normal.  Neurological: He is alert and oriented to person, place, and time.  Skin: Skin is warm and dry. Capillary refill takes less than 2 seconds.  Psychiatric: He has a normal mood and affect. His behavior is normal.  Nursing note and vitals reviewed.    Musculoskeletal Exam: C-spine, thoracic, lumbar spine good range of motion. He is good range of motion of bilateral shoulder joints. He has 10 contracture in his right although without any synovitis left elbow bilateral wrist joint MCPs PIPs DIPs with good range of motion with no synovitis. He is good range of motion of bilateral hip joints is tenderness over left trochanteric bursa area his left knee is replaced which has some warmth and swelling right knee joint has no warmth and swelling ankle joints MTPs, PIPs with good range of motion  with no synovitis.  CDAI Exam: CDAI Homunculus Exam:   Joint Counts:  CDAI Tender Joint count: 0 CDAI Swollen Joint count: 0  Global Assessments:  Patient Global Assessment: 7 Provider Global Assessment: 5  CDAI Calculated Score: 12    Investigation: Findings:  September 2015: TB: Negative, hepatitis panel negative, chest x-ray normal, szSPEP negative, G6PD normal, CK 341,    Imaging: No results found.  Speciality Comments: No specialty comments available.    Procedures:  No procedures performed Allergies: Vicodin [hydrocodone-acetaminophen] and Lisinopril   Assessment / Plan:     Visit Diagnoses: Rheumatoid arthritis  - Positive rheumatoid factor, negative CCP, negative ANA, positive synovitis ultrasound 07/21/2014. He did not take Plaquenil as he was concerned about the side effects of Plaquenil. He does not have much synovitis on examination but he continues to have a lot of stiffness in his multiple joints. He had some synovitis on ultrasound examination. We discussed different  treatment options. He is not interested in taking Plaquenil. Indications side effects contraindications of different medications were discussed. He is interested in starting on Azulfidine EN. Handout was given consent was taken the plan is to start him on sulfasalazine today.  High-risk medication use -  Azulfidine EN 500 mg 2 tablets po bid - Plan: CBC with Differential/Platelet, COMPLETE METABOLIC PANEL WITH GFR in 1 month and then every 3 months to monitor for drug toxicity.  H/O total knee replacement, left: He continues to have chronic discomfort.  Primary osteoarthritis of left hip: He has some pain.  Left trochanteric bursitis: ITB and exercises were demonstrated and handout was given.  He has following other medical problems:  HERNIATED CERVICAL DISC  H/O repair of left rotator cuff  Morbid obesity: Weight loss diet and exercise was discussed.  HYPERTENSION: His blood pressure  was elevated today have advised him to monitor his blood pressure closely.  Type 2 diabetes mellitus     Orders: Orders Placed This Encounter  Procedures  . CBC with Differential/Platelet  . COMPLETE METABOLIC PANEL WITH GFR   Meds ordered this encounter  Medications  . sulfaSALAzine (AZULFIDINE EN-TABS) 500 MG EC tablet    Sig: Take 2 tablets (1,000 mg total) by mouth 2 (two) times daily.    Dispense:  120 tablet    Refill:  2    Face-to-face time spent with patient was 30 minutes. 50% of time was spent in counseling and coordination of care.  Follow-Up Instructions: Return in about 3 months (around 12/25/2016) for Rheumatoid arthritis.   Pollyann Savoy, MD

## 2016-09-24 ENCOUNTER — Encounter: Payer: Self-pay | Admitting: Rheumatology

## 2016-09-24 ENCOUNTER — Ambulatory Visit (INDEPENDENT_AMBULATORY_CARE_PROVIDER_SITE_OTHER): Payer: Medicare Other | Admitting: Rheumatology

## 2016-09-24 VITALS — BP 147/95 | HR 61 | Resp 13 | Ht 67.0 in | Wt 217.0 lb

## 2016-09-24 DIAGNOSIS — Z96652 Presence of left artificial knee joint: Secondary | ICD-10-CM

## 2016-09-24 DIAGNOSIS — M1612 Unilateral primary osteoarthritis, left hip: Secondary | ICD-10-CM

## 2016-09-24 DIAGNOSIS — Z79899 Other long term (current) drug therapy: Secondary | ICD-10-CM

## 2016-09-24 DIAGNOSIS — Z9889 Other specified postprocedural states: Secondary | ICD-10-CM | POA: Diagnosis not present

## 2016-09-24 DIAGNOSIS — M5020 Other cervical disc displacement, unspecified cervical region: Secondary | ICD-10-CM | POA: Diagnosis not present

## 2016-09-24 DIAGNOSIS — M0579 Rheumatoid arthritis with rheumatoid factor of multiple sites without organ or systems involvement: Secondary | ICD-10-CM | POA: Diagnosis not present

## 2016-09-24 DIAGNOSIS — I10 Essential (primary) hypertension: Secondary | ICD-10-CM | POA: Diagnosis not present

## 2016-09-24 DIAGNOSIS — E119 Type 2 diabetes mellitus without complications: Secondary | ICD-10-CM | POA: Diagnosis not present

## 2016-09-24 MED ORDER — SULFASALAZINE 500 MG PO TBEC: 1000.0000 mg | DELAYED_RELEASE_TABLET | Freq: Two times a day (BID) | ORAL | 2 refills | Status: DC

## 2016-09-24 NOTE — Progress Notes (Addendum)
Pharmacy Note  Subjective:  Patient presents today to the [facility] to see Dr. Corliss Skains.  Patient seen by pharmacist for counseling on sulfasalazine.    Objective: CMP (06/21/14)  SCr: 0.73 mg/dL AST: 22 U/L ALT: 25 U/L  CBC (06/21/14) WBC: 7.6 K/uL Hgb: 13.3 g/dL Hct: 33.3 % PLT: 545 K/uL  G6PD: normal (06/21/14)  Assessment/Plan:   Patient was counseled on the purpose, proper use, and adverse effects of sulfasalazine including risk of infection and chance of nausea, headache, and sun sensitivity.  Discussed risk of skin rash and advised patient to stop the medication and let us know if she develops a rash. Also discussed for the potential of discoloration of the urine, sweat, or tears.  Reviewed the importance of frequent labs to monitor liver, kidneys, and blood counts; and provided patient with standing lab instructions.  Provided patient with educational materials on sulfasalazine and answered all questions.  Patient consented to sulfasalazine use.  Will upload consent into patient's chart.   Lilla Shook, Pharm.D., BCPS Clinical Pharmacist Pager: [phone] Phone: [phone] 09/24/2016 9:54 AM

## 2016-09-24 NOTE — Patient Instructions (Addendum)
Supplements for OA Natural anti-inflammatories  You can purchase these at [employer], [employer] or online.  . Turmeric (capsules)  . Ginger (ginger root or capsules)  . Omega 3 (Fish, flax seeds, chia seeds, walnuts, almonds)  . Tart cherry (dried or extract)   Patient should be under the care of a physician while taking these supplements. This may not be reproduced without the permission of Dr. Pollyann Savoy. Standing Labs We placed an order today for your standing lab work.    Please come back and get your standing labs in 1 month then every 3 months  We have open lab Monday through Friday from 8:30-11:30 AM and 1-4 PM at the office of Dr. Arbutus Ped, PA.   The office is located at [address], [address], [place], [place] [address] No appointment is necessary.   Labs are drawn by [facility].  You may receive a bill from [facility] for your lab work.     Sulfasalazine delayed release tablets What is this medicine? SULFASALAZINE (sul fa SAL a zeen) is for ulcerative colitis and certain types of rheumatoid arthritis. This medicine may be used for other purposes; ask your health care provider or pharmacist if you have questions. COMMON BRAND NAME(S): Azulfidine En-Tabs, Sulfazine EC What should I tell my health care provider before I take this medicine? They need to know if you have any of these conditions: -asthma -blood disorders or anemia -glucose-6-phosphate dehydrogenase (G6PD) deficiency -intestinal obstruction -kidney disease -liver disease -porphyria -urinary tract obstruction -an unusual reaction to sulfasalazine, sulfa drugs, salicylates, other medicines, foods, dyes, or preservatives -pregnant or trying to get pregnant -breast-feeding How should I use this medicine? Take this medicine by mouth with a full glass of water. Follow the directions on the prescription label. If the medicine upsets your stomach, take it with food or milk. Do not  crush or chew the tablets. Swallow the tablets whole. Take your medicine at regular intervals. Do not take your medicine more often than directed. Do not stop taking except on your doctor's advice. Talk to your pediatrician regarding the use of this medicine in children. Special care may be needed. While this drug may be prescribed for children as young as 6 years for selected conditions, precautions do apply. Patients over 10 years old may have a stronger reaction and need a smaller dose. Overdosage: If you think you have taken too much of this medicine contact a poison control center or emergency room at once. NOTE: This medicine is only for you. Do not share this medicine with others. What if I miss a dose? If you miss a dose, take it as soon as you can. If it is almost time for your next dose, take only that dose. Do not take double or extra doses. What may interact with this medicine? -digoxin -folic acid This list may not describe all possible interactions. Give your health care provider a list of all the medicines, herbs, non-prescription drugs, or dietary supplements you use. Also tell them if you smoke, drink alcohol, or use illegal drugs. Some items may interact with your medicine. What should I watch for while using this medicine? Visit your doctor or health care professional for regular checks on your progress. You will need frequent blood and urine checks. This medicine can make you more sensitive to the sun. Keep out of the sun. If you cannot avoid being in the sun, wear protective clothing and use sunscreen. Do not use sun lamps or tanning beds/booths.  Drink plenty of water while taking this medicine. Tell your doctor if you see the tablet in your stools. Your body may not be absorbing the medicine. What side effects may I notice from receiving this medicine? Side effects that you should report to your doctor or health care professional as soon as possible: -allergic reactions like  skin rash, itching or hives, swelling of the face, lips, or tongue -fever, chills, or any other sign of infection -painful, difficult, or reduced urination -redness, blistering, peeling or loosening of the skin, including inside the mouth -severe stomach pain -unusual bleeding or bruising -unusually weak or tired -yellowing of the skin or eyes Side effects that usually do not require medical attention (report to your doctor or health care professional if they continue or are bothersome): -headache -loss of appetite -nausea, vomiting -orange color to the urine -reduced sperm count This list may not describe all possible side effects. Call your doctor for medical advice about side effects. You may report side effects to FDA at 1-800-FDA-1088. Where should I keep my medicine? Keep out of the reach of children. Store at room temperature between 15 and 30 degrees C (59 and 86 degrees F). Keep container tightly closed. Throw away any unused medicine after the expiration date. NOTE: This sheet is a summary. It m   Iliotibial Band Syndrome Rehab Ask your health care provider which exercises are safe for you. Do exercises exactly as told by your health care provider and adjust them as directed. It is normal to feel mild stretching, pulling, tightness, or discomfort as you do these exercises, but you should stop right away if you feel sudden pain or your pain gets worse.Do not begin these exercises until told by your health care provider. Stretching and range of motion exercises These exercises warm up your muscles and joints and improve the movement and flexibility of your hip and pelvis. Exercise A: Quadriceps, prone 1. Lie on your abdomen on a firm surface, such as a bed or padded floor. 2. Bend your left / right knee and hold your ankle. If you cannot reach your ankle or pant leg, loop a belt around your foot and grab the belt instead. 3. Gently pull your heel toward your buttocks. Your knee  should not slide out to the side. You should feel a stretch in the front of your thigh and knee. 4. Hold this position for __________ seconds. Repeat __________ times. Complete this stretch __________ times a day. Exercise B: Iliotibial band 1. Lie on your side with your left / right leg in the top position. 2. Bend both of your knees and grab your left / right ankle. Stretch out your bottom arm to help you balance. 3. Slowly bring your top knee back so your thigh goes behind your trunk. 4. Slowly lower your top leg toward the floor until you feel a gentle stretch on the outside of your left / right hip and thigh. If you do not feel a stretch and your knee will not fall farther, place the heel of your other foot on top of your knee and pull your knee down toward the floor with your foot. 5. Hold this position for __________ seconds. Repeat __________ times. Complete this stretch __________ times a day. Strengthening exercises These exercises build strength and endurance in your hip and pelvis. Endurance is the ability to use your muscles for a long time, even after they get tired. Exercise C: Straight leg raises (hip abductors) 1. Lie on your  side with your left / right leg in the top position. Lie so your head, shoulder, knee, and hip line up. You may bend your bottom knee to help you balance. 2. Roll your hips slightly forward so your hips are stacked directly over each other and your left / right knee is facing forward. 3. Tense the muscles in your outer thigh and lift your top leg 4-6 inches (10-15 cm). 4. Hold this position for __________ seconds. 5. Slowly return to the starting position. Let your muscles relax completely before doing another repetition. Repeat __________ times. Complete this exercise __________ times a day. Exercise D: Straight leg raises (hip extensors) 1. Lie on your abdomen on your bed or a firm surface. You can put a pillow under your hips if that is more  comfortable. 2. Bend your left / right knee so your foot is straight up in the air. 3. Squeeze your buttock muscles and lift your left / right thigh off the bed. Do not let your back arch. 4. Tense this muscle as hard as you can without increasing any knee pain. 5. Hold this position for __________ seconds. 6. Slowly lower your leg to the starting position and allow it to relax completely. Repeat __________ times. Complete this exercise __________ times a day. Exercise E: Hip hike 1. Stand sideways on a bottom step. Stand on your left / right leg with your other foot unsupported next to the step. You can hold onto the railing or wall if needed for balance. 2. Keep your knees straight and your torso square. Then, lift your left / right hip up toward the ceiling. 3. Slowly let your left / right hip lower toward the floor, past the starting position. Your foot should get closer to the floor. Do not lean or bend your knees. Repeat __________ times. Complete this exercise __________ times a day. This information is not intended to replace advice given to you by your health care provider. Make sure you discuss any questions you have with your health care provider. Document Released: 10/15/2005 Document Revised: 06/19/2016 Document Reviewed: 09/16/2015 Elsevier Interactive Patient Education  2017 [employer]. ay not cover all possible information. If you have questions about this medicine, talk to your doctor, pharmacist, or health care provider.  2017 Elsevier/Gold Standard (2008-06-18 13:02:26)

## 2016-12-13 DIAGNOSIS — Z0000 Encounter for general adult medical examination without abnormal findings: Secondary | ICD-10-CM | POA: Diagnosis not present

## 2016-12-13 DIAGNOSIS — Z1389 Encounter for screening for other disorder: Secondary | ICD-10-CM | POA: Diagnosis not present

## 2016-12-13 DIAGNOSIS — Z125 Encounter for screening for malignant neoplasm of prostate: Secondary | ICD-10-CM | POA: Diagnosis not present

## 2016-12-13 DIAGNOSIS — Z23 Encounter for immunization: Secondary | ICD-10-CM | POA: Diagnosis not present

## 2016-12-13 DIAGNOSIS — E663 Overweight: Secondary | ICD-10-CM | POA: Diagnosis not present

## 2016-12-13 DIAGNOSIS — Z6835 Body mass index (BMI) 35.0-35.9, adult: Secondary | ICD-10-CM | POA: Diagnosis not present

## 2016-12-13 DIAGNOSIS — M069 Rheumatoid arthritis, unspecified: Secondary | ICD-10-CM | POA: Diagnosis not present

## 2016-12-13 DIAGNOSIS — I10 Essential (primary) hypertension: Secondary | ICD-10-CM | POA: Diagnosis not present

## 2016-12-19 ENCOUNTER — Ambulatory Visit: Payer: Medicare Other | Admitting: Rheumatology

## 2017-01-18 ENCOUNTER — Other Ambulatory Visit: Payer: Self-pay | Admitting: Gastroenterology

## 2017-01-21 ENCOUNTER — Ambulatory Visit (HOSPITAL_COMMUNITY)
Admission: RE | Admit: 2017-01-21 | Discharge: 2017-01-21 | Disposition: A | Discharge status: Home / Self Care | Payer: Medicare Other | Source: Ambulatory Visit | Attending: Gastroenterology | Admitting: Gastroenterology

## 2017-01-21 ENCOUNTER — Encounter (HOSPITAL_COMMUNITY)
Admission: RE | Disposition: A | Discharge status: Home / Self Care | Payer: Self-pay | Source: Ambulatory Visit | Attending: Gastroenterology

## 2017-01-21 ENCOUNTER — Ambulatory Visit (HOSPITAL_COMMUNITY): Payer: Medicare Other | Admitting: Registered Nurse

## 2017-01-21 ENCOUNTER — Encounter (HOSPITAL_COMMUNITY): Payer: Self-pay | Admitting: *Deleted

## 2017-01-21 DIAGNOSIS — M069 Rheumatoid arthritis, unspecified: Secondary | ICD-10-CM | POA: Insufficient documentation

## 2017-01-21 DIAGNOSIS — M5020 Other cervical disc displacement, unspecified cervical region: Secondary | ICD-10-CM | POA: Insufficient documentation

## 2017-01-21 DIAGNOSIS — R51 Headache: Secondary | ICD-10-CM | POA: Insufficient documentation

## 2017-01-21 DIAGNOSIS — Z1211 Encounter for screening for malignant neoplasm of colon: Secondary | ICD-10-CM | POA: Diagnosis not present

## 2017-01-21 DIAGNOSIS — M4714 Other spondylosis with myelopathy, thoracic region: Secondary | ICD-10-CM | POA: Insufficient documentation

## 2017-01-21 DIAGNOSIS — E669 Obesity, unspecified: Secondary | ICD-10-CM | POA: Diagnosis not present

## 2017-01-21 DIAGNOSIS — I10 Essential (primary) hypertension: Secondary | ICD-10-CM | POA: Insufficient documentation

## 2017-01-21 DIAGNOSIS — Z79899 Other long term (current) drug therapy: Secondary | ICD-10-CM | POA: Insufficient documentation

## 2017-01-21 DIAGNOSIS — Z6833 Body mass index (BMI) 33.0-33.9, adult: Secondary | ICD-10-CM | POA: Diagnosis not present

## 2017-01-21 DIAGNOSIS — Z96652 Presence of left artificial knee joint: Secondary | ICD-10-CM | POA: Insufficient documentation

## 2017-01-21 HISTORY — PX: COLONOSCOPY WITH PROPOFOL: SHX5780

## 2017-01-21 SURGERY — COLONOSCOPY WITH PROPOFOL
Anesthesia: Monitor Anesthesia Care

## 2017-01-21 MED ORDER — PROPOFOL 10 MG/ML IV BOLUS
INTRAVENOUS | Status: AC
  Filled 2017-01-21: qty 20

## 2017-01-21 MED ORDER — LACTATED RINGERS IV SOLN
INTRAVENOUS | Status: DC | PRN
  Administered 2017-01-21: 07:00:00 via INTRAVENOUS

## 2017-01-21 MED ORDER — LACTATED RINGERS IV SOLN
INTRAVENOUS | Status: DC
  Administered 2017-01-21: 07:00:00 via INTRAVENOUS

## 2017-01-21 MED ORDER — PROPOFOL 500 MG/50ML IV EMUL
INTRAVENOUS | Status: DC | PRN
  Administered 2017-01-21: 250 ug/kg/min via INTRAVENOUS

## 2017-01-21 MED ORDER — PROPOFOL 10 MG/ML IV BOLUS
INTRAVENOUS | Status: AC
  Filled 2017-01-21: qty 40

## 2017-01-21 SURGICAL SUPPLY — 22 items

## 2017-01-21 NOTE — Transfer of Care (Signed)
Immediate Anesthesia Transfer of Care Note  Patient: Curtis Mendez  Procedure(s) Performed: Procedure(s): COLONOSCOPY WITH PROPOFOL (N/A)  Patient Location: PACU  Anesthesia Type:MAC  Level of Consciousness: awake, alert , oriented and patient cooperative  Airway & Oxygen Therapy: Patient Spontanous Breathing and Patient connected to face mask oxygen  Post-op Assessment: Report given to RN, Post -op Vital signs reviewed and stable and Patient moving all extremities X 4  Post vital signs: stable  Last Vitals:  Vitals:   01/21/17 0643  BP: (!) 151/89  Pulse: 62  Resp: 17  Temp: 36.7 C    Last Pain:  Vitals:   01/21/17 0643  TempSrc: Oral         Complications: No apparent anesthesia complications

## 2017-01-21 NOTE — Op Note (Signed)
[facility] Patient Name: Curtis Mendez Procedure Date: 01/21/2017 MRN: [id number] Attending MD: Garlan Fair , MD Date of Birth: October 05, 1966 CSN: [id number] Age: 51 Admit Type: Outpatient Procedure:                Colonoscopy Indications:              Screening for colorectal malignant neoplasm Providers:                Garlan Fair, MD, Carmie End, RN, Cherylynn Ridges, Technician, Enrigue Catena, CRNA Referring MD:              Medicines:                Propofol per Anesthesia Complications:            No immediate complications. Estimated Blood Loss:     Estimated blood loss: none. Procedure:                Pre-Anesthesia Assessment:                           - Prior to the procedure, a History and Physical                            was performed, and patient medications and                            allergies were reviewed. The patient's tolerance of                            previous anesthesia was also reviewed. The risks                            and benefits of the procedure and the sedation                            options and risks were discussed with the patient.                            All questions were answered, and informed consent                            was obtained. Prior Anticoagulants: The patient has                            taken no previous anticoagulant or antiplatelet                            agents. ASA Grade Assessment: II - A patient with                            mild systemic disease. After reviewing the risks  and benefits, the patient was deemed in                            satisfactory condition to undergo the procedure.                           After obtaining informed consent, the colonoscope                            was passed under direct vision. Throughout the                            procedure, the patient's blood pressure, pulse, and                  oxygen saturations were monitored continuously. The                            EC-[id number] ([id number]) scope was introduced through                            the anus and advanced to the the cecum, identified                            by appendiceal orifice and ileocecal valve. The                            colonoscopy was performed without difficulty. The                            patient tolerated the procedure well. The quality                            of the bowel preparation was good. The appendiceal                            orifice and the rectum were photographed. Scope In: 7:39:05 AM Scope Out: 7:56:20 AM Scope Withdrawal Time: 0 hours 11 minutes 27 seconds  Total Procedure Duration: 0 hours 17 minutes 15 seconds  Findings:      The perianal and digital rectal examinations were normal.      The entire examined colon appeared normal. Impression:               - The entire examined colon is normal.                           - No specimens collected. Moderate Sedation:      N/A- Per Anesthesia Care Recommendation:           - Patient has a contact number available for                            emergencies. The signs and symptoms of potential                            delayed complications were  discussed with the                            patient. Return to normal activities tomorrow.                            Written discharge instructions were provided to the                            patient.                           - Repeat colonoscopy in 10 years for screening                            purposes.                           - Resume previous diet.                           - Continue present medications. Procedure Code(s):        --- Professional ---                           K8206, Colorectal cancer screening; colonoscopy on                            individual not meeting criteria for high risk Diagnosis Code(s):        --- Professional ---                            Z12.11, Encounter for screening for malignant                            neoplasm of colon CPT copyright 2016 [employer]. All rights reserved. The codes documented in this report are preliminary and upon coder review may  be revised to meet current compliance requirements. Earle Gell, MD Garlan Fair, MD 01/21/2017 8:02:09 AM This report has been signed electronically. Number of Addenda: 0

## 2017-01-21 NOTE — Anesthesia Preprocedure Evaluation (Signed)
Anesthesia Evaluation  Patient identified by MRN, date of birth, ID band Patient awake    Reviewed: Allergy & Precautions, H&P , NPO status , Patient's Chart, lab work & pertinent test results, reviewed documented beta blocker date and time   Airway Mallampati: II  TM Distance: >3 FB Neck ROM: full    Dental no notable dental hx. 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   Pulmonary exam normal breath sounds clear to auscultation       Cardiovascular Exercise Tolerance: Good hypertension, Pt. on home beta blockers negative cardio ROS Normal cardiovascular exam Rhythm:regular Rate:Normal     Neuro/Psych  Headaches, Cervical HNP.  Thoracic spondylosis with myelopathy. negative neurological ROS  negative psych ROS   GI/Hepatic negative GI ROS, Neg liver ROS,   Endo/Other  negative endocrine ROSMorbid obesity  Renal/GU negative Renal ROS  negative genitourinary   Musculoskeletal   Abdominal   Peds  Hematology negative hematology ROS (+)   Anesthesia Other Findings   Reproductive/Obstetrics negative OB ROS                             Anesthesia Physical  Anesthesia Plan  ASA: II  Anesthesia Plan: MAC   Post-op Pain Management:    Induction: Intravenous  Airway Management Planned: Simple Face Mask  Additional Equipment:   Intra-op Plan:   Post-operative Plan:   Informed Consent: I have reviewed the patients History and Physical, chart, labs and discussed the procedure including the risks, benefits and alternatives for the proposed anesthesia with the patient or authorized representative who has indicated his/her understanding and acceptance.   Dental Advisory Given  Plan Discussed with: CRNA and Surgeon  Anesthesia Plan Comments:         Anesthesia Quick Evaluation

## 2017-01-21 NOTE — Anesthesia Postprocedure Evaluation (Signed)
Anesthesia Post Note  Patient: Curtis Mendez  Procedure(s) Performed: Procedure(s) (LRB): COLONOSCOPY WITH PROPOFOL (N/A)  Patient location during evaluation: PACU Anesthesia Type: MAC Level of consciousness: awake and alert Pain management: pain level controlled Vital Signs Assessment: post-procedure vital signs reviewed and stable Respiratory status: spontaneous breathing, nonlabored ventilation and respiratory function stable Cardiovascular status: stable and blood pressure returned to baseline Anesthetic complications: no       Last Vitals:  Vitals:   01/21/17 0810 01/21/17 0820  BP: 126/74 (!) 147/101  Pulse: 67 66  Resp: 14 15  Temp:      Last Pain:  Vitals:   01/21/17 0805  TempSrc: Oral                 Lynda Rainwater

## 2017-01-21 NOTE — H&P (Signed)
Procedure: Baseline screening colonoscopy. No family history of colon cancer.  History: The patient is a 51 year old male born 23-Nov-1965. He is scheduled to undergo his first screening colonoscopy with polypectomy to prevent colon cancer.  Past medical history: Hypertension. Rheumatoid arthritis. Left knee replacement surgery. Left rotator cuff surgery. Vocal cord implant surgery. Gastric sleeve performed on 07/21/2015. Left carpal tunnel release surgery.  Medication allergies: Lisinopril caused angioedema  Exam: The patient is alert and lying comfortably on the endoscopy stretcher. Abdomen is soft and nontender to palpation. Cardiac exam reveals a regular rhythm. Lungs are clear to auscultation.  Plan: Proceed with baseline screening colonoscopy

## 2017-01-21 NOTE — Discharge Instructions (Signed)

## 2017-01-22 ENCOUNTER — Encounter (HOSPITAL_COMMUNITY): Payer: Self-pay | Admitting: Gastroenterology

## 2017-01-22 DIAGNOSIS — Z1211 Encounter for screening for malignant neoplasm of colon: Secondary | ICD-10-CM | POA: Diagnosis not present

## 2017-06-27 DIAGNOSIS — Z6834 Body mass index (BMI) 34.0-34.9, adult: Secondary | ICD-10-CM | POA: Diagnosis not present

## 2017-06-27 DIAGNOSIS — E663 Overweight: Secondary | ICD-10-CM | POA: Diagnosis not present

## 2017-06-27 DIAGNOSIS — M069 Rheumatoid arthritis, unspecified: Secondary | ICD-10-CM | POA: Diagnosis not present

## 2017-06-27 DIAGNOSIS — I10 Essential (primary) hypertension: Secondary | ICD-10-CM | POA: Diagnosis not present

## 2017-06-27 DIAGNOSIS — E291 Testicular hypofunction: Secondary | ICD-10-CM | POA: Diagnosis not present

## 2017-11-25 DIAGNOSIS — H35413 Lattice degeneration of retina, bilateral: Secondary | ICD-10-CM | POA: Diagnosis not present

## 2017-11-25 DIAGNOSIS — H31001 Unspecified chorioretinal scars, right eye: Secondary | ICD-10-CM | POA: Diagnosis not present

## 2017-11-25 DIAGNOSIS — E119 Type 2 diabetes mellitus without complications: Secondary | ICD-10-CM | POA: Diagnosis not present

## 2017-12-17 DIAGNOSIS — E669 Obesity, unspecified: Secondary | ICD-10-CM | POA: Diagnosis not present

## 2017-12-17 DIAGNOSIS — Z125 Encounter for screening for malignant neoplasm of prostate: Secondary | ICD-10-CM | POA: Diagnosis not present

## 2017-12-17 DIAGNOSIS — E291 Testicular hypofunction: Secondary | ICD-10-CM | POA: Diagnosis not present

## 2017-12-17 DIAGNOSIS — Z23 Encounter for immunization: Secondary | ICD-10-CM | POA: Diagnosis not present

## 2017-12-17 DIAGNOSIS — I10 Essential (primary) hypertension: Secondary | ICD-10-CM | POA: Diagnosis not present

## 2017-12-17 DIAGNOSIS — Z0000 Encounter for general adult medical examination without abnormal findings: Secondary | ICD-10-CM | POA: Diagnosis not present

## 2017-12-17 DIAGNOSIS — E119 Type 2 diabetes mellitus without complications: Secondary | ICD-10-CM | POA: Diagnosis not present

## 2017-12-17 DIAGNOSIS — Z1389 Encounter for screening for other disorder: Secondary | ICD-10-CM | POA: Diagnosis not present

## 2018-07-07 DIAGNOSIS — E7800 Pure hypercholesterolemia, unspecified: Secondary | ICD-10-CM | POA: Diagnosis not present

## 2018-07-07 DIAGNOSIS — I10 Essential (primary) hypertension: Secondary | ICD-10-CM | POA: Diagnosis not present

## 2018-07-07 DIAGNOSIS — E663 Overweight: Secondary | ICD-10-CM | POA: Diagnosis not present

## 2018-07-07 DIAGNOSIS — Z6838 Body mass index (BMI) 38.0-38.9, adult: Secondary | ICD-10-CM | POA: Diagnosis not present

## 2018-07-07 DIAGNOSIS — E1169 Type 2 diabetes mellitus with other specified complication: Secondary | ICD-10-CM | POA: Diagnosis not present

## 2018-11-10 DIAGNOSIS — H5202 Hypermetropia, left eye: Secondary | ICD-10-CM | POA: Diagnosis not present

## 2018-11-10 DIAGNOSIS — H52223 Regular astigmatism, bilateral: Secondary | ICD-10-CM | POA: Diagnosis not present

## 2018-11-10 DIAGNOSIS — H524 Presbyopia: Secondary | ICD-10-CM | POA: Diagnosis not present

## 2018-11-10 DIAGNOSIS — H11153 Pinguecula, bilateral: Secondary | ICD-10-CM | POA: Diagnosis not present

## 2018-11-10 DIAGNOSIS — H35413 Lattice degeneration of retina, bilateral: Secondary | ICD-10-CM | POA: Diagnosis not present

## 2019-01-01 DIAGNOSIS — I10 Essential (primary) hypertension: Secondary | ICD-10-CM | POA: Diagnosis not present

## 2019-01-01 DIAGNOSIS — Z79899 Other long term (current) drug therapy: Secondary | ICD-10-CM | POA: Diagnosis not present

## 2019-01-01 DIAGNOSIS — Z9884 Bariatric surgery status: Secondary | ICD-10-CM | POA: Diagnosis not present

## 2019-01-06 DIAGNOSIS — Z8739 Personal history of other diseases of the musculoskeletal system and connective tissue: Secondary | ICD-10-CM | POA: Diagnosis not present

## 2019-01-06 DIAGNOSIS — Z01810 Encounter for preprocedural cardiovascular examination: Secondary | ICD-10-CM | POA: Diagnosis not present

## 2019-01-06 DIAGNOSIS — I10 Essential (primary) hypertension: Secondary | ICD-10-CM | POA: Diagnosis not present

## 2019-01-06 DIAGNOSIS — E7800 Pure hypercholesterolemia, unspecified: Secondary | ICD-10-CM | POA: Diagnosis not present

## 2019-01-13 DIAGNOSIS — I361 Nonrheumatic tricuspid (valve) insufficiency: Secondary | ICD-10-CM | POA: Diagnosis not present

## 2019-01-13 DIAGNOSIS — I340 Nonrheumatic mitral (valve) insufficiency: Secondary | ICD-10-CM | POA: Diagnosis not present

## 2019-01-21 DIAGNOSIS — Z01818 Encounter for other preprocedural examination: Secondary | ICD-10-CM | POA: Diagnosis not present

## 2019-01-21 DIAGNOSIS — G4733 Obstructive sleep apnea (adult) (pediatric): Secondary | ICD-10-CM | POA: Diagnosis not present

## 2019-03-02 DIAGNOSIS — K8020 Calculus of gallbladder without cholecystitis without obstruction: Secondary | ICD-10-CM | POA: Diagnosis not present

## 2019-03-02 DIAGNOSIS — I10 Essential (primary) hypertension: Secondary | ICD-10-CM | POA: Diagnosis not present

## 2019-03-02 DIAGNOSIS — Z903 Acquired absence of stomach [part of]: Secondary | ICD-10-CM | POA: Diagnosis not present

## 2019-03-25 DIAGNOSIS — F4323 Adjustment disorder with mixed anxiety and depressed mood: Secondary | ICD-10-CM | POA: Diagnosis not present

## 2019-03-26 DIAGNOSIS — Z9884 Bariatric surgery status: Secondary | ICD-10-CM | POA: Diagnosis not present

## 2019-03-26 DIAGNOSIS — Z713 Dietary counseling and surveillance: Secondary | ICD-10-CM | POA: Diagnosis not present

## 2019-03-27 DIAGNOSIS — E1169 Type 2 diabetes mellitus with other specified complication: Secondary | ICD-10-CM | POA: Diagnosis not present

## 2019-03-27 DIAGNOSIS — E7800 Pure hypercholesterolemia, unspecified: Secondary | ICD-10-CM | POA: Diagnosis not present

## 2019-03-27 DIAGNOSIS — Z125 Encounter for screening for malignant neoplasm of prostate: Secondary | ICD-10-CM | POA: Diagnosis not present

## 2019-03-27 DIAGNOSIS — I10 Essential (primary) hypertension: Secondary | ICD-10-CM | POA: Diagnosis not present

## 2019-04-09 DIAGNOSIS — I10 Essential (primary) hypertension: Secondary | ICD-10-CM | POA: Diagnosis not present

## 2019-04-09 DIAGNOSIS — Z903 Acquired absence of stomach [part of]: Secondary | ICD-10-CM | POA: Diagnosis not present

## 2019-04-09 DIAGNOSIS — K8020 Calculus of gallbladder without cholecystitis without obstruction: Secondary | ICD-10-CM | POA: Diagnosis not present

## 2019-05-20 DIAGNOSIS — I10 Essential (primary) hypertension: Secondary | ICD-10-CM | POA: Diagnosis not present

## 2019-05-20 DIAGNOSIS — Z903 Acquired absence of stomach [part of]: Secondary | ICD-10-CM | POA: Diagnosis not present

## 2019-05-20 DIAGNOSIS — Z6838 Body mass index (BMI) 38.0-38.9, adult: Secondary | ICD-10-CM | POA: Diagnosis not present

## 2019-05-20 DIAGNOSIS — K8020 Calculus of gallbladder without cholecystitis without obstruction: Secondary | ICD-10-CM | POA: Diagnosis not present

## 2019-05-26 DIAGNOSIS — Z9884 Bariatric surgery status: Secondary | ICD-10-CM | POA: Diagnosis not present

## 2019-05-26 DIAGNOSIS — R0683 Snoring: Secondary | ICD-10-CM | POA: Diagnosis not present

## 2019-05-26 DIAGNOSIS — Z888 Allergy status to other drugs, medicaments and biological substances status: Secondary | ICD-10-CM | POA: Diagnosis not present

## 2019-05-26 DIAGNOSIS — Z885 Allergy status to narcotic agent status: Secondary | ICD-10-CM | POA: Diagnosis not present

## 2019-05-26 DIAGNOSIS — M1990 Unspecified osteoarthritis, unspecified site: Secondary | ICD-10-CM | POA: Diagnosis not present

## 2019-05-26 DIAGNOSIS — I10 Essential (primary) hypertension: Secondary | ICD-10-CM | POA: Diagnosis not present

## 2019-05-26 DIAGNOSIS — Z9889 Other specified postprocedural states: Secondary | ICD-10-CM | POA: Diagnosis not present

## 2019-05-26 DIAGNOSIS — Z79899 Other long term (current) drug therapy: Secondary | ICD-10-CM | POA: Diagnosis not present

## 2019-05-26 DIAGNOSIS — K8010 Calculus of gallbladder with chronic cholecystitis without obstruction: Secondary | ICD-10-CM | POA: Diagnosis not present

## 2019-05-26 DIAGNOSIS — K8020 Calculus of gallbladder without cholecystitis without obstruction: Secondary | ICD-10-CM | POA: Diagnosis not present

## 2019-05-26 DIAGNOSIS — R1010 Upper abdominal pain, unspecified: Secondary | ICD-10-CM | POA: Diagnosis not present

## 2019-07-13 DIAGNOSIS — Z903 Acquired absence of stomach [part of]: Secondary | ICD-10-CM | POA: Diagnosis not present

## 2019-07-22 DIAGNOSIS — Z903 Acquired absence of stomach [part of]: Secondary | ICD-10-CM | POA: Diagnosis not present

## 2019-07-22 DIAGNOSIS — I10 Essential (primary) hypertension: Secondary | ICD-10-CM | POA: Diagnosis not present

## 2019-08-11 DIAGNOSIS — Z01818 Encounter for other preprocedural examination: Secondary | ICD-10-CM | POA: Diagnosis not present

## 2019-08-18 DIAGNOSIS — R0683 Snoring: Secondary | ICD-10-CM | POA: Diagnosis not present

## 2019-08-18 DIAGNOSIS — Z96652 Presence of left artificial knee joint: Secondary | ICD-10-CM | POA: Diagnosis not present

## 2019-08-18 DIAGNOSIS — Z9852 Vasectomy status: Secondary | ICD-10-CM | POA: Diagnosis not present

## 2019-08-18 DIAGNOSIS — Z9889 Other specified postprocedural states: Secondary | ICD-10-CM | POA: Diagnosis not present

## 2019-08-18 DIAGNOSIS — Z9884 Bariatric surgery status: Secondary | ICD-10-CM | POA: Diagnosis not present

## 2019-08-18 DIAGNOSIS — Z20828 Contact with and (suspected) exposure to other viral communicable diseases: Secondary | ICD-10-CM | POA: Diagnosis not present

## 2019-08-18 DIAGNOSIS — K8020 Calculus of gallbladder without cholecystitis without obstruction: Secondary | ICD-10-CM | POA: Diagnosis not present

## 2019-08-18 DIAGNOSIS — I10 Essential (primary) hypertension: Secondary | ICD-10-CM | POA: Diagnosis not present

## 2019-08-18 DIAGNOSIS — Z6837 Body mass index (BMI) 37.0-37.9, adult: Secondary | ICD-10-CM | POA: Diagnosis not present

## 2019-08-18 DIAGNOSIS — M1990 Unspecified osteoarthritis, unspecified site: Secondary | ICD-10-CM | POA: Diagnosis not present

## 2019-08-18 DIAGNOSIS — Z79899 Other long term (current) drug therapy: Secondary | ICD-10-CM | POA: Diagnosis not present

## 2019-08-19 DIAGNOSIS — M1990 Unspecified osteoarthritis, unspecified site: Secondary | ICD-10-CM | POA: Diagnosis not present

## 2019-08-19 DIAGNOSIS — I10 Essential (primary) hypertension: Secondary | ICD-10-CM | POA: Diagnosis not present

## 2019-08-19 DIAGNOSIS — Z96652 Presence of left artificial knee joint: Secondary | ICD-10-CM | POA: Diagnosis not present

## 2019-08-19 DIAGNOSIS — Z9884 Bariatric surgery status: Secondary | ICD-10-CM | POA: Diagnosis not present

## 2019-08-19 DIAGNOSIS — Z20828 Contact with and (suspected) exposure to other viral communicable diseases: Secondary | ICD-10-CM | POA: Diagnosis not present

## 2019-09-05 DIAGNOSIS — Z23 Encounter for immunization: Secondary | ICD-10-CM | POA: Diagnosis not present

## 2019-09-10 DIAGNOSIS — Z713 Dietary counseling and surveillance: Secondary | ICD-10-CM | POA: Diagnosis not present

## 2019-09-10 DIAGNOSIS — Z9884 Bariatric surgery status: Secondary | ICD-10-CM | POA: Diagnosis not present

## 2019-12-28 DIAGNOSIS — I10 Essential (primary) hypertension: Secondary | ICD-10-CM | POA: Diagnosis not present

## 2019-12-28 DIAGNOSIS — E7800 Pure hypercholesterolemia, unspecified: Secondary | ICD-10-CM | POA: Diagnosis not present

## 2019-12-28 DIAGNOSIS — E669 Obesity, unspecified: Secondary | ICD-10-CM | POA: Diagnosis not present

## 2020-02-18 DIAGNOSIS — Z9884 Bariatric surgery status: Secondary | ICD-10-CM | POA: Diagnosis not present

## 2020-07-06 DIAGNOSIS — Z125 Encounter for screening for malignant neoplasm of prostate: Secondary | ICD-10-CM | POA: Diagnosis not present

## 2020-07-06 DIAGNOSIS — E7800 Pure hypercholesterolemia, unspecified: Secondary | ICD-10-CM | POA: Diagnosis not present

## 2020-07-06 DIAGNOSIS — E663 Overweight: Secondary | ICD-10-CM | POA: Diagnosis not present

## 2020-07-06 DIAGNOSIS — E1169 Type 2 diabetes mellitus with other specified complication: Secondary | ICD-10-CM | POA: Diagnosis not present

## 2020-07-06 DIAGNOSIS — Z0000 Encounter for general adult medical examination without abnormal findings: Secondary | ICD-10-CM | POA: Diagnosis not present

## 2020-07-06 DIAGNOSIS — I10 Essential (primary) hypertension: Secondary | ICD-10-CM | POA: Diagnosis not present

## 2020-07-06 DIAGNOSIS — Z9884 Bariatric surgery status: Secondary | ICD-10-CM | POA: Diagnosis not present

## 2020-07-06 DIAGNOSIS — M069 Rheumatoid arthritis, unspecified: Secondary | ICD-10-CM | POA: Diagnosis not present

## 2021-01-02 DIAGNOSIS — E1169 Type 2 diabetes mellitus with other specified complication: Secondary | ICD-10-CM | POA: Diagnosis not present

## 2021-01-02 DIAGNOSIS — E669 Obesity, unspecified: Secondary | ICD-10-CM | POA: Diagnosis not present

## 2021-01-02 DIAGNOSIS — E7800 Pure hypercholesterolemia, unspecified: Secondary | ICD-10-CM | POA: Diagnosis not present

## 2021-01-02 DIAGNOSIS — I10 Essential (primary) hypertension: Secondary | ICD-10-CM | POA: Diagnosis not present

## 2021-07-07 DIAGNOSIS — E7800 Pure hypercholesterolemia, unspecified: Secondary | ICD-10-CM | POA: Diagnosis not present

## 2021-07-07 DIAGNOSIS — I10 Essential (primary) hypertension: Secondary | ICD-10-CM | POA: Diagnosis not present

## 2021-07-07 DIAGNOSIS — E1169 Type 2 diabetes mellitus with other specified complication: Secondary | ICD-10-CM | POA: Diagnosis not present

## 2021-09-17 ENCOUNTER — Other Ambulatory Visit: Payer: Self-pay

## 2021-09-17 ENCOUNTER — Emergency Department (HOSPITAL_COMMUNITY)
Admission: EM | Admit: 2021-09-17 | Discharge: 2021-09-17 | Disposition: A | Discharge status: Home / Self Care | Payer: Medicare Other | Attending: Emergency Medicine | Admitting: Emergency Medicine

## 2021-09-17 DIAGNOSIS — Z79899 Other long term (current) drug therapy: Secondary | ICD-10-CM | POA: Diagnosis not present

## 2021-09-17 DIAGNOSIS — X509XXA Other and unspecified overexertion or strenuous movements or postures, initial encounter: Secondary | ICD-10-CM | POA: Diagnosis not present

## 2021-09-17 DIAGNOSIS — S39012A Strain of muscle, fascia and tendon of lower back, initial encounter: Secondary | ICD-10-CM | POA: Diagnosis not present

## 2021-09-17 DIAGNOSIS — E119 Type 2 diabetes mellitus without complications: Secondary | ICD-10-CM | POA: Diagnosis not present

## 2021-09-17 DIAGNOSIS — I10 Essential (primary) hypertension: Secondary | ICD-10-CM | POA: Diagnosis not present

## 2021-09-17 DIAGNOSIS — S3992XA Unspecified injury of lower back, initial encounter: Secondary | ICD-10-CM | POA: Diagnosis present

## 2021-09-17 DIAGNOSIS — Z96652 Presence of left artificial knee joint: Secondary | ICD-10-CM | POA: Insufficient documentation

## 2021-09-17 NOTE — ED Provider Notes (Signed)
[place] DEPT Provider Note   CSN: [id number] Arrival date & time: 09/17/21  0600     History Chief Complaint  Patient presents with   Back Pain    Curtis Mendez is a 55 y.o. male with no significant past medical history presents to the emergency department with 2 days of left lower back pain.  Patient reports that he sat up quickly in bed yesterday morning, felt a sharp pain, pulling sensation followed by decrease in range of motion.  Patient reports that he was able to improve the pain with ice, gentle stretching, and a hot bath and return to baseline.  Patient reports that he woke up this morning with the back feeling still tight, still feeling that pulling sensation, has now since resolved with gentle stretching, ice, and time.  Patient has not taken anything for pain, denies any pain at baseline, only some sharp pain when he awakes in the morning.  Patient denies history of IV drug use, chronic corticosteroid use, history of cancer, numbness, tingling, difficulty with urination, defecation, saddle anesthesia.   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/2016   Hypertension    Osteoarthritis of left hip 09/19/2016   Mild   Rheumatoid arthritis ([place]) 09/19/2016   +RF 344, CCP negative, ANA negative    Patient Active Problem List   Diagnosis Date Noted   High-risk medication use 09/20/2016   H/O repair of left rotator cuff 09/20/2016   Type 2 diabetes mellitus without complication, without long-term current use of insulin ([place]) 09/20/2016   Rheumatoid arthritis ([place]) 09/19/2016   H/O total knee replacement, left 09/19/2016   Osteoarthritis of left hip 09/19/2016   Loose total knee arthroplasty ([place]) 05/02/2012    OTHER DISEASES OF VOCAL CORDS 08/01/2010   STUTTERING 07/13/2010   WEAKNESS 07/13/2010   FLANK PAIN, LEFT 06/26/2010   ERECTILE DYSFUNCTION, SECONDARY TO MEDICATION 03/01/2010   OTHER EYE PROBLEMS 01/10/2010   ELBOW PAIN, RIGHT 10/12/2009   DIABETES MELLITUS 11/02/2008   EDEMA 10/28/2008   Morbid (severe) obesity due to excess calories ([place]) 08/09/2008   PAIN IN JOINT, SITE UNSPECIFIED 04/09/2008   HIP PAIN, LEFT 04/09/2008   CHRONIC TENSION TYPE HEADACHE 10/20/2007   SPONDYLOSIS, 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   HERNIATED CERVICAL DISC 07/28/2007   CARPAL TUNNEL SYNDROME, LEFT 07/04/2007   HYPERTENSION, BENIGN ESSENTIAL 07/04/2007   Pain in joint, lower leg 07/04/2007    Past Surgical History:  Procedure Laterality Date   CARPAL TUNNEL RELEASE Left    COLONOSCOPY WITH PROPOFOL N/A 01/21/2017   Procedure: COLONOSCOPY WITH PROPOFOL;  Surgeon: Garlan Fair, MD;  Location: [facility];  Service: Endoscopy;  Laterality: N/A;   HIP SURGERY Left    Left Bursa Removed   JOINT REPLACEMENT  04-30-12   LTKA 5'12-[facility]   ROTATOR CUFF REPAIR Left    TOTAL KNEE REVISION  05/02/2012   Procedure: TOTAL KNEE REVISION;  Surgeon: Mcarthur Rossetti, MD;  Location: [facility];  Service: Orthopedics;  Laterality: Left;  Revision Arthroplasty Left Knee       Family History  Problem Relation Age of Onset   Rheum arthritis Father    Hypertension Father     Social History   Tobacco Use   Smoking status: Never  Smokeless tobacco: Never  Substance Use Topics   Alcohol use: No   Drug use: No    Home Medications Prior to Admission medications   Medication Sig Start Date End Date Taking? Authorizing Provider  hydrochlorothiazide (HYDRODIURIL) 25 MG tablet Take 25 mg by mouth daily.    [provider]  metoprolol-hydrochlorothiazide (LOPRESSOR HCT) 50-25 MG tablet Take 1 tablet by mouth daily.    [provider]  sulfaSALAzine (AZULFIDINE EN-TABS) 500 MG EC tablet  Take 2 tablets (1,000 mg total) by mouth 2 (two) times daily. 09/24/16 10/24/16  Pollyann Savoy, MD  TURMERIC CURCUMIN PO Take 1 capsule by mouth daily.    [provider]    Allergies    Vicodin [hydrocodone-acetaminophen] and Lisinopril  Review of Systems   Review of Systems  Musculoskeletal:  Positive for back pain.  All other systems reviewed and are negative.  Physical Exam Updated Vital Signs BP (!) 162/105   Pulse 63   Temp 98.9 F (37.2 C) (Oral)   Resp 18   SpO2 100%   Physical Exam Vitals and nursing note reviewed.  Constitutional:      General: He is not in acute distress.    Appearance: Normal appearance.  HENT:     Head: Normocephalic and atraumatic.  Eyes:     General:        Right eye: No discharge.        Left eye: No discharge.  Cardiovascular:     Rate and Rhythm: Normal rate and regular rhythm.     Pulses: Normal pulses.  Pulmonary:     Effort: Pulmonary effort is normal. No respiratory distress.  Musculoskeletal:        General: No deformity.     Comments: No midline spinal tenderness.  Full range of lumbar and thoracic spine for flexion, extension, twisting, lateral flexion.  Minimal tenderness to palpation on the left lumbar paraspinous muscles.  Intact strength 5 out of 5 bilateral lower extremities.  Skin:    General: Skin is warm and dry.     Capillary Refill: Capillary refill takes less than 2 seconds.  Neurological:     Mental Status: He is alert and oriented to person, place, and time.  Psychiatric:        Mood and Affect: Mood normal.        Behavior: Behavior normal.    ED Results / Procedures / Treatments   Labs (all labs ordered are listed, but only abnormal results are displayed) Labs Reviewed - No data to display  EKG None  Radiology No results found.  Procedures Procedures   Medications Ordered in ED Medications - No data to display  ED Course  I have reviewed the triage vital signs and the nursing  notes.  Pertinent labs & imaging results that were available during my care of the patient were reviewed by me and considered in my medical decision making (see chart for details).    MDM Rules/Calculators/A&P                         Overall well-appearing man with neurovascularly intact legs bilaterally.  No red flags for back pain including saddle anesthesia, numbness, tingling of lower extremities, difficulty with urination, defecation.  Additionally patient without history of IV drug use, chronic corticosteroid use, cancer, recent fever.  Minimal clinical concern for compression fracture, epidural abscess, or other emergent spinal condition.  Presenting with a story that has clear indication for  probable lumbar paraspinous muscle strain.  Already with management, improvement with gentle stretching, ice, heat alone.  Discussed adding on ibuprofen for anti-inflammatory effects as well as Tylenol as needed for pain.  Encouraged continued active stretching.  Discussed follow-up with orthopedics if pain worsens or fails to improve.  Discussed return precautions.  Patient discharged in stable condition, return precautions were given. Final Clinical Impression(s) / ED Diagnoses Final diagnoses:  Strain of lumbar region, initial encounter    Rx / DC Orders ED Discharge Orders     None        Anselmo Pickler, PA-C 09/17/21 [id number]    Sherwood Gambler, MD 09/18/21 1521

## 2021-09-17 NOTE — ED Triage Notes (Signed)
Patient presents with left lower back pain that began last night after he attempted to get out of the bed. Patient is able to move, has full range of motion, but states he feels it "catching and pulling" when he tries to get up from a sitting position. Patient denies urinary or bowel issues, nausea, vomiting, diarrhea.

## 2021-09-17 NOTE — Discharge Instructions (Addendum)
Please use Tylenol or ibuprofen for pain.  You may use 600 mg ibuprofen every 6 hours or 1000 mg of Tylenol every 6 hours.  You may choose to alternate between the 2.  This would be most effective.  Not to exceed 4 g of Tylenol within 24 hours.  Not to exceed 3200 mg ibuprofen 24 hours.   As we discussed I do recommend gentle active stretching, especially in the morning. You can continue to use ice, or heat, whichever works best. If your pain continues without improving for 1-2 weeks I recommend you follow up with orthopedics.

## 2021-11-08 ENCOUNTER — Ambulatory Visit (INDEPENDENT_AMBULATORY_CARE_PROVIDER_SITE_OTHER): Payer: Medicare Other

## 2021-11-08 ENCOUNTER — Encounter: Payer: Self-pay | Admitting: Orthopaedic Surgery

## 2021-11-08 ENCOUNTER — Other Ambulatory Visit: Payer: Self-pay

## 2021-11-08 ENCOUNTER — Ambulatory Visit (INDEPENDENT_AMBULATORY_CARE_PROVIDER_SITE_OTHER): Payer: Medicare Other | Admitting: Orthopaedic Surgery

## 2021-11-08 DIAGNOSIS — M545 Low back pain, unspecified: Secondary | ICD-10-CM

## 2021-11-08 DIAGNOSIS — M25552 Pain in left hip: Secondary | ICD-10-CM

## 2021-11-08 MED ORDER — LIDOCAINE HCL 1 % IJ SOLN
3.0000 mL | INTRAMUSCULAR | Status: AC | PRN
  Administered 2021-11-08: 3 mL

## 2021-11-08 MED ORDER — METHYLPREDNISOLONE ACETATE 40 MG/ML IJ SUSP
40.0000 mg | INTRAMUSCULAR | Status: AC | PRN
  Administered 2021-11-08: 40 mg via INTRA_ARTICULAR

## 2021-11-08 NOTE — Progress Notes (Signed)
Office Visit Note   Patient: Curtis Mendez           Date of Birth: 26-Apr-1966           MRN: [id number] Visit Date: 11/08/2021              Requested by: Curtis Carol, MD [address] [place],   [id number] PCP: Curtis Carol, MD   Assessment & Plan: Visit Diagnoses:  1. Low back pain, unspecified back pain laterality, unspecified chronicity, unspecified whether sciatica present   2. Pain in left hip     Plan:  We will send him to formal therapy for back exercises, home exercise program, IT band stretching, hamstring stretching, and modalities.  See back in 6 weeks to see how he is doing overall.  He will follow-up sooner if he develops any radicular symptoms down the left leg.  Questions were encouraged and answered by Dr. Ninfa Linden and myself.  Follow-Up Instructions: Return in about 6 weeks (around 12/20/2021).   Orders:  Orders Placed This Encounter  Procedures   Large Joint Inj   XR Lumbar Spine 2-3 Views   XR HIP UNILAT W OR W/O PELVIS 1V LEFT   No orders of the defined types were placed in this encounter.     Procedures: Large Joint Inj: L greater trochanter on 11/08/2021 9:27 AM Indications: pain Details: 22 G 1.5 in needle, lateral approach  Arthrogram: No  Medications: 3 mL lidocaine 1 %; 40 mg methylPREDNISolone acetate 40 MG/ML Outcome: tolerated well, no immediate complications Procedure, treatment alternatives, risks and benefits explained, specific risks discussed. Consent was given by the patient. Immediately prior to procedure a time out was called to verify the correct patient, procedure, equipment, support staff and site/side marked as required. Patient was prepped and draped in the usual sterile fashion.      Clinical Data: No additional findings.   Subjective: Chief Complaint  Patient presents with   Lower Back - Pain   Left Hip - Pain    HPI Curtis Mendez 56 year old male well-known to Dr. Ninfa Linden service with a  history of left unicompartmental part mental knee conversion to left total knee in 2013.  He is 10 been seen since 2013 by Dr. Ninfa Linden.  Comes in today after being seen in the ER for low back and left hip pain.  States is really his hip and points to the lateral aspect of the hip.  States he is unable to sleep on the left side due to pain in the hip describes it as throbbing pain denies any radicular symptoms down the left leg.  Denies any back pain.  No known injury.  He states overall the knee pain is not as bad today as it has been.  He reports that he is not diabetic despite this being on his chart.  He does report a history of a bursectomy on the left side.  Review of Systems  Constitutional:  Negative for chills and fever.  Musculoskeletal:  Positive for back pain.    Objective: Vital Signs: There were no vitals taken for this visit.  Physical Exam Constitutional:      Appearance: He is not ill-appearing or diaphoretic.  Pulmonary:     Effort: Pulmonary effort is normal.  Neurological:     Mental Status: He is alert and oriented to person, place, and time.  Psychiatric:        Mood and Affect: Mood normal.    Back Exam  Tenderness  The patient is experiencing tenderness in the lumbar.  Range of Motion  Extension:  normal  Flexion:  normal   Muscle Strength  The patient has normal back strength.  Other  Sensation: normal Gait: normal    Bilateral hips excellent range of motion of both hips without pain.  He has tenderness over the left trochanteric region.  There is scar from surgical incision that is less than 1 inch over the left hip bursa region.  Specialty Comments:  No specialty comments available.  Imaging: XR HIP UNILAT W OR W/O PELVIS 1V LEFT  Result Date: 11/08/2021 AP pelvis lateral view left hip: Mild narrowing of the left hip joint compared to the right.  Acetabular osteophyte superiorly left side.  Otherwise both hips well located no acute fractures  bony abnormalities.  XR Lumbar Spine 2-3 Views  Result Date: 11/08/2021 Lumbar spine: Slight loss of lordotic curvature.  Shortened pedicles throughout the lumbar spine.  Grade 1 anterior spinal listhesis L4 on 5.  Otherwise displays well-maintained.  No acute fractures bony abnormalities.    PMFS History: Patient Active Problem List   Diagnosis Date Noted   High-risk medication use 09/20/2016   H/O repair of left rotator cuff 09/20/2016   Type 2 diabetes mellitus without complication, without long-term current use of insulin ([place]) 09/20/2016   Rheumatoid arthritis ([place]) 09/19/2016   H/O total knee replacement, left 09/19/2016   Osteoarthritis of left hip 09/19/2016   Loose total knee arthroplasty ([place]) 05/02/2012   OTHER DISEASES OF VOCAL CORDS 08/01/2010   STUTTERING 07/13/2010   WEAKNESS 07/13/2010   FLANK PAIN, LEFT 06/26/2010   ERECTILE DYSFUNCTION, SECONDARY TO MEDICATION 03/01/2010   OTHER EYE PROBLEMS 01/10/2010   ELBOW PAIN, RIGHT 10/12/2009   DIABETES MELLITUS 11/02/2008   EDEMA 10/28/2008   Morbid (severe) obesity due to excess calories ([place]) 08/09/2008   PAIN IN JOINT, SITE UNSPECIFIED 04/09/2008   HIP PAIN, LEFT 04/09/2008   CHRONIC TENSION TYPE HEADACHE 10/20/2007   SPONDYLOSIS, 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   HERNIATED CERVICAL DISC 07/28/2007   CARPAL TUNNEL SYNDROME, LEFT 07/04/2007   HYPERTENSION, BENIGN ESSENTIAL 07/04/2007   Pain in joint, lower leg 07/04/2007   Past Medical History:  Diagnosis Date   Arthritis 04-30-12   Arthritis osteoarthritis-left knee   Complication of anesthesia 04-30-12   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   H/O total knee replacement, left 09/19/2016   Hypertension    Osteoarthritis of left hip 09/19/2016   Mild   Rheumatoid arthritis ([place]) 09/19/2016   +RF 344, CCP negative, ANA negative    Family History  Problem Relation Age of Onset   Rheum arthritis Father    Hypertension  Father     Past Surgical History:  Procedure Laterality Date   CARPAL TUNNEL RELEASE Left    COLONOSCOPY WITH PROPOFOL N/A 01/21/2017   Procedure: COLONOSCOPY WITH PROPOFOL;  Surgeon: Garlan Fair, MD;  Location: [facility];  Service: Endoscopy;  Laterality: N/A;   HIP SURGERY Left    Left Bursa Removed   JOINT REPLACEMENT  04-30-12   LTKA 5'12-[facility]   ROTATOR CUFF REPAIR Left    TOTAL KNEE REVISION  05/02/2012   Procedure: TOTAL KNEE REVISION;  Surgeon: Mcarthur Rossetti, MD;  Location: [facility];  Service: Orthopedics;  Laterality: Left;  Revision Arthroplasty Left Knee   Social History   Occupational History   Not on file  Tobacco Use   Smoking status: Never   Smokeless tobacco:  Never  Substance and Sexual Activity   Alcohol use: No   Drug use: No   Sexual activity: Yes

## 2021-11-29 ENCOUNTER — Telehealth: Payer: Self-pay

## 2021-11-29 ENCOUNTER — Telehealth: Payer: Self-pay | Admitting: Radiology

## 2021-11-29 ENCOUNTER — Other Ambulatory Visit: Payer: Self-pay

## 2021-11-29 ENCOUNTER — Encounter: Payer: Self-pay | Admitting: Orthopaedic Surgery

## 2021-11-29 ENCOUNTER — Ambulatory Visit (INDEPENDENT_AMBULATORY_CARE_PROVIDER_SITE_OTHER): Payer: Medicare Other | Admitting: Orthopaedic Surgery

## 2021-11-29 DIAGNOSIS — M47816 Spondylosis without myelopathy or radiculopathy, lumbar region: Secondary | ICD-10-CM | POA: Diagnosis not present

## 2021-11-29 DIAGNOSIS — M4316 Spondylolisthesis, lumbar region: Secondary | ICD-10-CM | POA: Diagnosis not present

## 2021-11-29 DIAGNOSIS — M48061 Spinal stenosis, lumbar region without neurogenic claudication: Secondary | ICD-10-CM | POA: Diagnosis not present

## 2021-11-29 DIAGNOSIS — M545 Low back pain, unspecified: Secondary | ICD-10-CM

## 2021-11-29 DIAGNOSIS — M4804 Spinal stenosis, thoracic region: Secondary | ICD-10-CM | POA: Diagnosis not present

## 2021-11-29 DIAGNOSIS — M4807 Spinal stenosis, lumbosacral region: Secondary | ICD-10-CM | POA: Diagnosis not present

## 2021-11-29 MED ORDER — CYCLOBENZAPRINE HCL 10 MG PO TABS: 10.0000 mg | ORAL_TABLET | Freq: Three times a day (TID) | ORAL | 0 refills | Status: AC | PRN

## 2021-11-29 MED ORDER — METHYLPREDNISOLONE 4 MG PO TABS: ORAL_TABLET | ORAL | 0 refills | Status: DC

## 2021-11-29 NOTE — Telephone Encounter (Signed)
Patient does not want and injection with Curtis Mendez, he wants to be referred to Neurosurgery, I have placed an order for it per Curtis Mendez.

## 2021-11-29 NOTE — Progress Notes (Signed)
Office Visit Note   Patient: Curtis Mendez           Date of Birth: 27-Oct-1966           MRN: [id number] Visit Date: 11/29/2021              Requested by: Seward Carol, MD [address] [place],  [place] [address] PCP: Seward Carol, MD   Assessment & Plan: Visit Diagnoses: No diagnosis found.  Plan:  Discussed with Mr. Matuszek since he has now developed radicular symptoms I feel that this is actually coming from his back and is not due to hip bursitis.  Explained to him that often is still present with trochanteric like symptoms at first and then things will progress.  He has tried exercises at home due to the fact that he is having severe debilitating pain at this point time recommend MRI to evaluate for HNP as the source of his radicular symptoms down the left leg.  Placed him on a Medrol Dosepak and Flexeril.  Discussed with him no driving while on Flexeril.  Questions were encouraged and answered at length.  We will see him back after the MRI.  Results discussed with the treatment.  Follow-Up Instructions: No follow-ups on file.   Orders:  No orders of the defined types were placed in this encounter.  Meds ordered this encounter  Medications   methylPREDNISolone (MEDROL) 4 MG tablet    Sig: Take as directed    Dispense:  21 tablet    Refill:  0   cyclobenzaprine (FLEXERIL) 10 MG tablet    Sig: Take 1 tablet (10 mg total) by mouth 3 (three) times daily as needed for muscle spasms.    Dispense:  30 tablet    Refill:  0      Procedures: No procedures performed   Clinical Data: No additional findings.   Subjective: Chief Complaint  Patient presents with   Lower Back - Follow-up    HPI Mr. Ekholm returns today due to the fact that his hip pain as now became radicular pain down the left leg.  He states he is also having some pain burning-like in his buttocks region that was not there previously.  He notes that the injection helped for about a day  and then his symptoms got dramatically worse.  He has had no new injury.  He is having no bowel or bladder dysfunction no saddle anesthesia like symptoms.  He is having wakening pain due to the back.  He is taking Tylenol and using ice on his back without any real relief.  He is now ambulating with a crutch due to the pain in his left leg.  He notes that the left pain radiates down the lateral aspect of the leg into the left lateral foot and heel area.  Again he notes that he is not diabetic.   Review of Systems See HPI otherwise negative  Objective: Vital Signs: There were no vitals taken for this visit.  Physical Exam General: Well-developed well-nourished male no acute distress Psych: Alert and oriented x3 Ortho Exam Positive straight leg raise on the left.  Unable to examine strength on the left 5 out of 5 strength throughout the right.  He is able to dorsiflex plantarflex the left ankle against gravity but unable to perform this against resistance due to severe pain.  Unable to assess the lower leg strength secondary to severe pain.  Subjective decrease sensation throughout the dorsal  aspect of the left foot compared to the right which is full sensation.  Dorsal pedal pulses are 2+ and equal and symmetric bilaterally.  Specialty Comments:  No specialty comments available.  Imaging: No results found.   PMFS History: Patient Active Problem List   Diagnosis Date Noted   High-risk medication use 09/20/2016   H/O repair of left rotator cuff 09/20/2016   Type 2 diabetes mellitus without complication, without long-term current use of insulin ([place]) 09/20/2016   Rheumatoid arthritis ([place]) 09/19/2016   H/O total knee replacement, left 09/19/2016   Osteoarthritis of left hip 09/19/2016   Loose total knee arthroplasty ([place]) 05/02/2012   OTHER DISEASES OF VOCAL CORDS 08/01/2010   STUTTERING 07/13/2010   WEAKNESS 07/13/2010   FLANK PAIN, LEFT 06/26/2010   ERECTILE DYSFUNCTION, SECONDARY TO  MEDICATION 03/01/2010   OTHER EYE PROBLEMS 01/10/2010   ELBOW PAIN, RIGHT 10/12/2009   DIABETES MELLITUS 11/02/2008   EDEMA 10/28/2008   Morbid (severe) obesity due to excess calories ([place]) 08/09/2008   PAIN IN JOINT, SITE UNSPECIFIED 04/09/2008   HIP PAIN, LEFT 04/09/2008   CHRONIC TENSION TYPE HEADACHE 10/20/2007   SPONDYLOSIS, 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   HERNIATED CERVICAL DISC 07/28/2007   CARPAL TUNNEL SYNDROME, LEFT 07/04/2007   HYPERTENSION, BENIGN ESSENTIAL 07/04/2007   Pain in joint, lower leg 07/04/2007   Past Medical History:  Diagnosis Date   Arthritis 04-30-12   Arthritis osteoarthritis-left knee   Complication of anesthesia 04-30-12   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   H/O total knee replacement, left 09/19/2016   Hypertension    Osteoarthritis of left hip 09/19/2016   Mild   Rheumatoid arthritis ([place]) 09/19/2016   +RF 344, CCP negative, ANA negative    Family History  Problem Relation Age of Onset   Rheum arthritis Father    Hypertension Father     Past Surgical History:  Procedure Laterality Date   CARPAL TUNNEL RELEASE Left    COLONOSCOPY WITH PROPOFOL N/A 01/21/2017   Procedure: COLONOSCOPY WITH PROPOFOL;  Surgeon: Garlan Fair, MD;  Location: [facility];  Service: Endoscopy;  Laterality: N/A;   HIP SURGERY Left    Left Bursa Removed   JOINT REPLACEMENT  04-30-12   LTKA 5'12-[facility]   ROTATOR CUFF REPAIR Left    TOTAL KNEE REVISION  05/02/2012   Procedure: TOTAL KNEE REVISION;  Surgeon: Mcarthur Rossetti, MD;  Location: [facility];  Service: Orthopedics;  Laterality: Left;  Revision Arthroplasty Left Knee   Social History   Occupational History   Not on file  Tobacco Use   Smoking status: Never   Smokeless tobacco: Never  Substance and Sexual Activity   Alcohol use: No   Drug use: No   Sexual activity: Yes

## 2021-11-29 NOTE — Telephone Encounter (Signed)
Patient called into the office stating that he had his MRI today and Dr Magnus Ivan wanted him to come in as soon as possible. The soonest I could get him in was the 13th is there any way he can get in sooner ??   Please advise

## 2021-11-29 NOTE — Addendum Note (Signed)
Addended by: Penne Lash, Otis Dials on: 11/29/2021 05:31 PM   Modules accepted: Orders

## 2021-11-30 NOTE — Telephone Encounter (Signed)
Referral has been submitted to [facility] neurosurgery and spine thru [facility] as urgent

## 2021-12-04 ENCOUNTER — Other Ambulatory Visit: Payer: Self-pay | Admitting: Neurosurgery

## 2021-12-04 DIAGNOSIS — M48061 Spinal stenosis, lumbar region without neurogenic claudication: Secondary | ICD-10-CM | POA: Diagnosis not present

## 2021-12-04 DIAGNOSIS — M4316 Spondylolisthesis, lumbar region: Secondary | ICD-10-CM | POA: Diagnosis not present

## 2021-12-04 DIAGNOSIS — Z6831 Body mass index (BMI) 31.0-31.9, adult: Secondary | ICD-10-CM | POA: Diagnosis not present

## 2021-12-11 ENCOUNTER — Ambulatory Visit: Payer: Medicare Other | Admitting: Orthopaedic Surgery

## 2021-12-11 NOTE — Progress Notes (Signed)
Surgical Instructions    Your procedure is scheduled on 12/15/21.  Report to [facility] Main Entrance "A" at 10:15 A.M., then check in with the Admitting office.  Call this number if you have problems the morning of surgery:  [phone]   If you have any questions prior to your surgery date call [phone]: Open Monday-Friday 8am-4pm    Remember:  Do not eat after midnight the night before your surgery  You may drink clear liquids until 9:15 the morning of your surgery.   Clear liquids allowed are: Water, Non-Citrus Juices (without pulp), Carbonated Beverages, Clear Tea, Black Coffee ONLY (NO MILK, CREAM OR POWDERED CREAMER of any kind), and Gatorade    Take these medicines the morning of surgery with A SIP OF WATER:  amLODipine (NORVASC) metoprolol tartrate (LOPRESSOR)   As of today, STOP taking any Aspirin (unless otherwise instructed by your surgeon) Aleve, Naproxen, Ibuprofen, Motrin, Advil, Goody's, BC's, all herbal medications, fish oil, and all vitamins.           Do not wear jewelry  Do not wear lotions, powders, colognes, or deodorant. Do not shave 48 hours prior to surgery.  Men may shave face and neck. Do not bring valuables to the hospital.   [facility] is not responsible for any belongings or valuables. .   Do NOT Smoke (Tobacco/Vaping)  24 hours prior to your procedure  If you use a CPAP at night, you may bring your mask for your overnight stay.   Contacts, glasses, hearing aids, dentures or partials may not be worn into surgery, please bring cases for these belongings   For patients admitted to the hospital, discharge time will be determined by your treatment team.   Patients discharged the day of surgery will not be allowed to drive home, and someone needs to stay with them for 24 hours.  NO VISITORS WILL BE ALLOWED IN PRE-OP WHERE PATIENTS ARE PREPPED FOR SURGERY.  ONLY 1 SUPPORT PERSON MAY BE PRESENT IN THE WAITING ROOM WHILE YOU ARE IN SURGERY.  IF  YOU ARE TO BE ADMITTED, ONCE YOU ARE IN YOUR ROOM YOU WILL BE ALLOWED TWO (2) VISITORS. 1 (ONE) VISITOR MAY STAY OVERNIGHT BUT MUST ARRIVE TO THE ROOM BY 8pm.  Minor children may have two parents present. Special consideration for safety and communication needs will be reviewed on a case by case basis.  Special instructions:    Oral Hygiene is also important to reduce your risk of infection.  Remember - BRUSH YOUR TEETH THE MORNING OF SURGERY WITH YOUR REGULAR TOOTHPASTE   [place]- Preparing For Surgery  Before surgery, you can play an important role. Because skin is not sterile, your skin needs to be as free of germs as possible. You can reduce the number of germs on your skin by washing with CHG (chlorahexidine gluconate) Soap before surgery.  CHG is an antiseptic cleaner which kills germs and bonds with the skin to continue killing germs even after washing.     Please do not use if you have an allergy to CHG or antibacterial soaps. If your skin becomes reddened/irritated stop using the CHG.  Do not shave (including legs and underarms) for at least 48 hours prior to first CHG shower. It is OK to shave your face.  Please follow these instructions carefully.     Shower the NIGHT BEFORE SURGERY and the MORNING OF SURGERY with CHG Soap.   If you chose to wash your hair, wash your hair first  as usual with your normal shampoo. After you shampoo, rinse your hair and body thoroughly to remove the shampoo.  Then [employer] and genitals (private parts) with your normal soap and rinse thoroughly to remove soap.  After that Use CHG Soap as you would any other liquid soap. You can apply CHG directly to the skin and wash gently with a scrungie or a clean washcloth.   Apply the CHG Soap to your body ONLY FROM THE NECK DOWN.  Do not use on open wounds or open sores. Avoid contact with your eyes, ears, mouth and genitals (private parts). Wash Face and genitals (private parts)  with your normal soap.    Wash thoroughly, paying special attention to the area where your surgery will be performed.  Thoroughly rinse your body with warm water from the neck down.  DO NOT shower/wash with your normal soap after using and rinsing off the CHG Soap.  Pat yourself dry with a CLEAN TOWEL.  Wear CLEAN PAJAMAS to bed the night before surgery  Place CLEAN SHEETS on your bed the night before your surgery  DO NOT SLEEP WITH PETS.   Day of Surgery:  Take a shower with CHG soap. Wear Clean/Comfortable clothing the morning of surgery Do not apply any deodorants/lotions.   Remember to brush your teeth WITH YOUR REGULAR TOOTHPASTE.    COVID testing  If you are going to stay overnight or be admitted after your procedure/surgery and require a pre-op COVID test, please follow these instructions after your COVID test   You are not required to quarantine however you are required to wear a well-fitting mask when you are out and around people not in your household.  If your mask becomes wet or soiled, replace with a new one.  Wash your hands often with soap and water for 20 seconds or clean your hands with an alcohol-based hand sanitizer that contains at least 60% alcohol.  Do not share personal items.  Notify your provider: if you are in close contact with someone who has COVID  or if you develop a fever of 100.4 or greater, sneezing, cough, sore throat, shortness of breath or body aches.    Please read over the following fact sheets that you were given.

## 2021-12-12 ENCOUNTER — Encounter (HOSPITAL_COMMUNITY): Payer: Self-pay

## 2021-12-12 ENCOUNTER — Other Ambulatory Visit: Payer: Self-pay

## 2021-12-12 ENCOUNTER — Encounter (HOSPITAL_COMMUNITY)
Admission: RE | Admit: 2021-12-12 | Discharge: 2021-12-12 | Disposition: A | Discharge status: Home / Self Care | Payer: Medicare Other | Source: Ambulatory Visit | Attending: Neurosurgery | Admitting: Neurosurgery

## 2021-12-12 VITALS — BP 136/77 | HR 79 | Temp 98.8°F | Resp 18 | Ht 67.0 in | Wt 205.6 lb

## 2021-12-12 DIAGNOSIS — Z20822 Contact with and (suspected) exposure to covid-19: Secondary | ICD-10-CM | POA: Diagnosis not present

## 2021-12-12 DIAGNOSIS — Z01818 Encounter for other preprocedural examination: Secondary | ICD-10-CM

## 2021-12-12 LAB — BASIC METABOLIC PANEL
Anion gap: 8 (ref 5–15)
BUN: 14 mg/dL (ref 6–20)
CO2: 27 mmol/L (ref 22–32)
Calcium: 9.8 mg/dL (ref 8.9–10.3)
Chloride: 101 mmol/L (ref 98–111)
Creatinine, Ser: 0.85 mg/dL (ref 0.61–1.24)
GFR, Estimated: >60 mL/min (ref >60)
Glucose, Bld: 105 mg/dL — ABNORMAL HIGH (ref 70–99)
Potassium: 4.2 mmol/L (ref 3.5–5.1)
Sodium: 136 mmol/L (ref 135–145)

## 2021-12-12 LAB — CBC
HCT: 43.8 % (ref 39.0–52.0)
Hemoglobin: 15 g/dL (ref 13.0–17.0)
MCH: 28.7 pg (ref 26.0–34.0)
MCHC: 34.2 g/dL (ref 30.0–36.0)
MCV: 83.9 fL (ref 80.0–100.0)
Platelets: 277 10*3/uL (ref 150–400)
RBC: 5.22 MIL/uL (ref 4.22–5.81)
RDW: 13.2 % (ref 11.5–15.5)
WBC: 8 10*3/uL (ref 4.0–10.5)
nRBC: 0 % (ref 0.0–0.2)

## 2021-12-12 LAB — SARS CORONAVIRUS 2 (TAT 6-24 HRS): SARS Coronavirus 2: NEGATIVE

## 2021-12-12 LAB — SURGICAL PCR SCREEN
MRSA, PCR: NEGATIVE
Staphylococcus aureus: NEGATIVE

## 2021-12-12 LAB — TYPE AND SCREEN
ABO/RH(D): O POS
Antibody Screen: NEGATIVE

## 2021-12-12 NOTE — Progress Notes (Signed)
PCP: Renford Dillsonald Polite, MD Cardiologist: Carlyle LipaSunil Jayant Desai, MD  EKG:12/12/21 CXR: 07/01/14 ECHO: 01/13/19 CE Stress Test: >10-15 years Cardiac Cath: denies  Fasting Blood Sugar- na Checks Blood Sugar_na__ times a day  ASA/Blood Thinner: No  OSA/CPAP: No  Covid test 12/12/21 at PAT  Anesthesia Review: No.  All cardiac studies normal per patient.  Patient denies shortness of breath, fever, cough, and chest pain at PAT appointment.  Patient verbalized understanding of instructions provided today at the PAT appointment.  Patient asked to review instructions at home and day of surgery.

## 2021-12-15 ENCOUNTER — Inpatient Hospital Stay (HOSPITAL_COMMUNITY): Payer: Medicare Other | Admitting: Anesthesiology

## 2021-12-15 ENCOUNTER — Encounter (HOSPITAL_COMMUNITY)
Admission: RE | Disposition: A | Discharge status: Home / Self Care | Payer: Self-pay | Source: Home / Self Care | Attending: Neurosurgery

## 2021-12-15 ENCOUNTER — Observation Stay (HOSPITAL_COMMUNITY)
Admission: RE | Admit: 2021-12-15 | Discharge: 2021-12-16 | Disposition: A | Discharge status: Home / Self Care | Payer: Medicare Other | Attending: Neurosurgery | Admitting: Neurosurgery

## 2021-12-15 ENCOUNTER — Other Ambulatory Visit: Payer: Self-pay

## 2021-12-15 ENCOUNTER — Encounter (HOSPITAL_COMMUNITY): Payer: Self-pay | Admitting: Neurosurgery

## 2021-12-15 ENCOUNTER — Inpatient Hospital Stay (HOSPITAL_COMMUNITY): Payer: Medicare Other

## 2021-12-15 DIAGNOSIS — M1990 Unspecified osteoarthritis, unspecified site: Secondary | ICD-10-CM | POA: Diagnosis not present

## 2021-12-15 DIAGNOSIS — M4316 Spondylolisthesis, lumbar region: Secondary | ICD-10-CM

## 2021-12-15 DIAGNOSIS — Z419 Encounter for procedure for purposes other than remedying health state, unspecified: Secondary | ICD-10-CM

## 2021-12-15 DIAGNOSIS — M48061 Spinal stenosis, lumbar region without neurogenic claudication: Secondary | ICD-10-CM | POA: Diagnosis not present

## 2021-12-15 DIAGNOSIS — I10 Essential (primary) hypertension: Secondary | ICD-10-CM | POA: Diagnosis not present

## 2021-12-15 DIAGNOSIS — M48062 Spinal stenosis, lumbar region with neurogenic claudication: Secondary | ICD-10-CM | POA: Diagnosis not present

## 2021-12-15 DIAGNOSIS — Z981 Arthrodesis status: Secondary | ICD-10-CM | POA: Diagnosis not present

## 2021-12-15 DIAGNOSIS — M4326 Fusion of spine, lumbar region: Secondary | ICD-10-CM | POA: Diagnosis not present

## 2021-12-15 SURGERY — POSTERIOR LUMBAR FUSION 1 LEVEL
Anesthesia: General | Site: Back

## 2021-12-15 MED ORDER — MIDAZOLAM HCL 2 MG/2ML IJ SOLN
INTRAMUSCULAR | Status: DC | PRN
  Administered 2021-12-15: 2 mg via INTRAVENOUS

## 2021-12-15 MED ORDER — PROPOFOL 10 MG/ML IV BOLUS
INTRAVENOUS | Status: AC
  Filled 2021-12-15: qty 20

## 2021-12-15 MED ORDER — LIDOCAINE 2% (20 MG/ML) 5 ML SYRINGE
INTRAMUSCULAR | Status: AC
  Filled 2021-12-15: qty 5

## 2021-12-15 MED ORDER — ONDANSETRON HCL 4 MG PO TABS: 4.0000 mg | ORAL_TABLET | Freq: Four times a day (QID) | ORAL | Status: DC | PRN

## 2021-12-15 MED ORDER — ACETAMINOPHEN 500 MG PO TABS
1000.0000 mg | ORAL_TABLET | Freq: Once | ORAL | Status: AC
  Administered 2021-12-15: 1000 mg via ORAL
  Filled 2021-12-15: qty 2

## 2021-12-15 MED ORDER — GELATIN ABSORBABLE 100 EX MISC: CUTANEOUS | Status: DC | PRN

## 2021-12-15 MED ORDER — SUGAMMADEX SODIUM 200 MG/2ML IV SOLN
INTRAVENOUS | Status: DC | PRN
  Administered 2021-12-15: 200 mg via INTRAVENOUS

## 2021-12-15 MED ORDER — ROCURONIUM BROMIDE 10 MG/ML (PF) SYRINGE
PREFILLED_SYRINGE | INTRAVENOUS | Status: DC | PRN
Start: 2021-12-15 — End: 2021-12-15
  Administered 2021-12-15: 30 mg via INTRAVENOUS
  Administered 2021-12-15: 70 mg via INTRAVENOUS
  Administered 2021-12-15: 20 mg via INTRAVENOUS
  Administered 2021-12-15 (×2): 30 mg via INTRAVENOUS

## 2021-12-15 MED ORDER — LOSARTAN POTASSIUM-HCTZ 100-25 MG PO TABS: 1.0000 | ORAL_TABLET | Freq: Every day | ORAL | Status: DC

## 2021-12-15 MED ORDER — ACETAMINOPHEN 500 MG PO TABS
1000.0000 mg | ORAL_TABLET | Freq: Four times a day (QID) | ORAL | Status: DC
  Administered 2021-12-16 (×2): 1000 mg via ORAL
  Filled 2021-12-15 (×2): qty 2

## 2021-12-15 MED ORDER — DEXAMETHASONE SODIUM PHOSPHATE 10 MG/ML IJ SOLN
INTRAMUSCULAR | Status: AC
  Filled 2021-12-15: qty 1

## 2021-12-15 MED ORDER — SODIUM CHLORIDE 0.9% FLUSH: 3.0000 mL | INTRAVENOUS | Status: DC | PRN

## 2021-12-15 MED ORDER — FENTANYL CITRATE (PF) 250 MCG/5ML IJ SOLN
INTRAMUSCULAR | Status: AC
  Filled 2021-12-15: qty 5

## 2021-12-15 MED ORDER — OXYCODONE HCL 5 MG/5ML PO SOLN: 5.0000 mg | Freq: Once | ORAL | Status: DC | PRN

## 2021-12-15 MED ORDER — MIDAZOLAM HCL 2 MG/2ML IJ SOLN: 0.5000 mg | Freq: Once | INTRAMUSCULAR | Status: DC | PRN

## 2021-12-15 MED ORDER — SENNOSIDES-DOCUSATE SODIUM 8.6-50 MG PO TABS: 1.0000 | ORAL_TABLET | Freq: Every evening | ORAL | Status: DC | PRN

## 2021-12-15 MED ORDER — SODIUM CHLORIDE 0.9 % IV SOLN: 250.0000 mL | INTRAVENOUS | Status: DC

## 2021-12-15 MED ORDER — PHENOL 1.4 % MT LIQD: 1.0000 | OROMUCOSAL | Status: DC | PRN

## 2021-12-15 MED ORDER — FENTANYL CITRATE (PF) 250 MCG/5ML IJ SOLN
INTRAMUSCULAR | Status: DC | PRN
Start: 2021-12-15 — End: 2021-12-15
  Administered 2021-12-15: 100 ug via INTRAVENOUS
  Administered 2021-12-15: 30 ug via INTRAVENOUS
  Administered 2021-12-15: 150 ug via INTRAVENOUS

## 2021-12-15 MED ORDER — ONDANSETRON HCL 4 MG/2ML IJ SOLN
INTRAMUSCULAR | Status: DC | PRN
  Administered 2021-12-15: 4 mg via INTRAVENOUS

## 2021-12-15 MED ORDER — LACTATED RINGERS IV SOLN: INTRAVENOUS | Status: DC

## 2021-12-15 MED ORDER — THROMBIN 20000 UNITS EX SOLR
CUTANEOUS | Status: AC
  Filled 2021-12-15: qty 20000

## 2021-12-15 MED ORDER — LIDOCAINE 2% (20 MG/ML) 5 ML SYRINGE
INTRAMUSCULAR | Status: DC | PRN
  Administered 2021-12-15: 40 mg via INTRAVENOUS

## 2021-12-15 MED ORDER — LIDOCAINE-EPINEPHRINE 0.5 %-1:200000 IJ SOLN
INTRAMUSCULAR | Status: DC | PRN
  Administered 2021-12-15: 13 mL

## 2021-12-15 MED ORDER — BISACODYL 5 MG PO TBEC: 5.0000 mg | DELAYED_RELEASE_TABLET | Freq: Every day | ORAL | Status: DC | PRN

## 2021-12-15 MED ORDER — ROCURONIUM BROMIDE 10 MG/ML (PF) SYRINGE
PREFILLED_SYRINGE | INTRAVENOUS | Status: AC
  Filled 2021-12-15: qty 10

## 2021-12-15 MED ORDER — PHENYLEPHRINE 40 MCG/ML (10ML) SYRINGE FOR IV PUSH (FOR BLOOD PRESSURE SUPPORT)
PREFILLED_SYRINGE | INTRAVENOUS | Status: DC | PRN
  Administered 2021-12-15: 120 ug via INTRAVENOUS

## 2021-12-15 MED ORDER — SENNA 8.6 MG PO TABS
1.0000 | ORAL_TABLET | Freq: Two times a day (BID) | ORAL | Status: DC
  Administered 2021-12-15 – 2021-12-16 (×2): 8.6 mg via ORAL
  Filled 2021-12-15 (×2): qty 1

## 2021-12-15 MED ORDER — CEFAZOLIN SODIUM-DEXTROSE 2-4 GM/100ML-% IV SOLN
INTRAVENOUS | Status: AC
  Filled 2021-12-15: qty 100

## 2021-12-15 MED ORDER — CEFAZOLIN SODIUM-DEXTROSE 2-4 GM/100ML-% IV SOLN
2.0000 g | INTRAVENOUS | Status: AC
  Administered 2021-12-15: 2 g via INTRAVENOUS

## 2021-12-15 MED ORDER — ONDANSETRON HCL 4 MG/2ML IJ SOLN
INTRAMUSCULAR | Status: AC
  Filled 2021-12-15: qty 2

## 2021-12-15 MED ORDER — CHLORHEXIDINE GLUCONATE CLOTH 2 % EX PADS: 6.0000 | MEDICATED_PAD | Freq: Once | CUTANEOUS | Status: DC

## 2021-12-15 MED ORDER — CHLORHEXIDINE GLUCONATE 0.12 % MT SOLN
15.0000 mL | OROMUCOSAL | Status: AC
  Filled 2021-12-15: qty 15

## 2021-12-15 MED ORDER — HYDROCHLOROTHIAZIDE 25 MG PO TABS
25.0000 mg | ORAL_TABLET | Freq: Every day | ORAL | Status: DC
  Administered 2021-12-16: 25 mg via ORAL
  Filled 2021-12-15: qty 1

## 2021-12-15 MED ORDER — DEXAMETHASONE SODIUM PHOSPHATE 10 MG/ML IJ SOLN
INTRAMUSCULAR | Status: DC | PRN
  Administered 2021-12-15: 10 mg via INTRAVENOUS

## 2021-12-15 MED ORDER — DIAZEPAM 5 MG PO TABS: 5.0000 mg | ORAL_TABLET | Freq: Four times a day (QID) | ORAL | Status: DC | PRN

## 2021-12-15 MED ORDER — HYDROMORPHONE HCL 1 MG/ML IJ SOLN
0.2500 mg | INTRAMUSCULAR | Status: DC | PRN
  Administered 2021-12-15 (×3): 0.5 mg via INTRAVENOUS

## 2021-12-15 MED ORDER — ZOLPIDEM TARTRATE 5 MG PO TABS: 5.0000 mg | ORAL_TABLET | Freq: Every evening | ORAL | Status: DC | PRN

## 2021-12-15 MED ORDER — AMLODIPINE BESYLATE 10 MG PO TABS
10.0000 mg | ORAL_TABLET | Freq: Every day | ORAL | Status: DC
  Administered 2021-12-16: 10 mg via ORAL
  Filled 2021-12-15: qty 1

## 2021-12-15 MED ORDER — ONDANSETRON HCL 4 MG/2ML IJ SOLN: 4.0000 mg | Freq: Four times a day (QID) | INTRAMUSCULAR | Status: DC | PRN

## 2021-12-15 MED ORDER — HEPARIN SODIUM (PORCINE) 5000 UNIT/ML IJ SOLN
5000.0000 [IU] | Freq: Three times a day (TID) | INTRAMUSCULAR | Status: DC
  Administered 2021-12-15 – 2021-12-16 (×2): 5000 [IU] via SUBCUTANEOUS
  Filled 2021-12-15 (×2): qty 1

## 2021-12-15 MED ORDER — HYDROMORPHONE HCL 1 MG/ML IJ SOLN
INTRAMUSCULAR | Status: AC
  Filled 2021-12-15: qty 1

## 2021-12-15 MED ORDER — MEPERIDINE HCL 25 MG/ML IJ SOLN: 6.2500 mg | INTRAMUSCULAR | Status: DC | PRN

## 2021-12-15 MED ORDER — CHLORHEXIDINE GLUCONATE 0.12 % MT SOLN
OROMUCOSAL | Status: AC
  Administered 2021-12-15: 15 mL
  Filled 2021-12-15: qty 15

## 2021-12-15 MED ORDER — LOSARTAN POTASSIUM 50 MG PO TABS
100.0000 mg | ORAL_TABLET | Freq: Every day | ORAL | Status: DC
  Administered 2021-12-16: 100 mg via ORAL
  Filled 2021-12-15: qty 2

## 2021-12-15 MED ORDER — ACETAMINOPHEN 650 MG RE SUPP: 650.0000 mg | RECTAL | Status: DC | PRN

## 2021-12-15 MED ORDER — OXYCODONE HCL ER 10 MG PO T12A
10.0000 mg | EXTENDED_RELEASE_TABLET | Freq: Two times a day (BID) | ORAL | Status: DC
  Administered 2021-12-15 – 2021-12-16 (×2): 10 mg via ORAL
  Filled 2021-12-15 (×2): qty 1

## 2021-12-15 MED ORDER — PROPOFOL 10 MG/ML IV BOLUS
INTRAVENOUS | Status: DC | PRN
  Administered 2021-12-15: 120 mg via INTRAVENOUS

## 2021-12-15 MED ORDER — HYDROMORPHONE HCL 1 MG/ML IJ SOLN
INTRAMUSCULAR | Status: AC
  Administered 2021-12-15: 0.5 mg via INTRAVENOUS
  Filled 2021-12-15: qty 1

## 2021-12-15 MED ORDER — 0.9 % SODIUM CHLORIDE (POUR BTL) OPTIME
TOPICAL | Status: DC | PRN
  Administered 2021-12-15: 1000 mL

## 2021-12-15 MED ORDER — LIDOCAINE-EPINEPHRINE 0.5 %-1:200000 IJ SOLN
INTRAMUSCULAR | Status: AC
  Filled 2021-12-15: qty 1

## 2021-12-15 MED ORDER — METOPROLOL TARTRATE 50 MG PO TABS
100.0000 mg | ORAL_TABLET | Freq: Two times a day (BID) | ORAL | Status: DC
  Administered 2021-12-15 – 2021-12-16 (×2): 100 mg via ORAL
  Filled 2021-12-15 (×2): qty 2

## 2021-12-15 MED ORDER — MENTHOL 3 MG MT LOZG: 1.0000 | LOZENGE | OROMUCOSAL | Status: DC | PRN

## 2021-12-15 MED ORDER — PROMETHAZINE HCL 25 MG/ML IJ SOLN: 6.2500 mg | INTRAMUSCULAR | Status: DC | PRN

## 2021-12-15 MED ORDER — OXYCODONE HCL 5 MG PO TABS
10.0000 mg | ORAL_TABLET | ORAL | Status: DC | PRN
  Administered 2021-12-16: 10 mg via ORAL
  Filled 2021-12-15: qty 2

## 2021-12-15 MED ORDER — OXYCODONE HCL 5 MG PO TABS
5.0000 mg | ORAL_TABLET | Freq: Once | ORAL | Status: DC | PRN
Start: 2021-12-15 — End: 2021-12-15

## 2021-12-15 MED ORDER — MIDAZOLAM HCL 2 MG/2ML IJ SOLN
INTRAMUSCULAR | Status: AC
  Filled 2021-12-15: qty 2

## 2021-12-15 MED ORDER — SODIUM CHLORIDE 0.9% FLUSH
3.0000 mL | Freq: Two times a day (BID) | INTRAVENOUS | Status: DC
  Administered 2021-12-15 – 2021-12-16 (×2): 3 mL via INTRAVENOUS

## 2021-12-15 MED ORDER — POTASSIUM CHLORIDE IN NACL 20-0.9 MEQ/L-% IV SOLN
INTRAVENOUS | Status: DC
  Filled 2021-12-15: qty 1000

## 2021-12-15 MED ORDER — OXYCODONE HCL 5 MG PO TABS: 5.0000 mg | ORAL_TABLET | ORAL | Status: DC | PRN

## 2021-12-15 MED ORDER — ALBUMIN HUMAN 5 % IV SOLN: INTRAVENOUS | Status: DC | PRN

## 2021-12-15 MED ORDER — BUPIVACAINE HCL (PF) 0.5 % IJ SOLN
INTRAMUSCULAR | Status: AC
  Filled 2021-12-15: qty 30

## 2021-12-15 MED ORDER — ACETAMINOPHEN 325 MG PO TABS: 650.0000 mg | ORAL_TABLET | ORAL | Status: DC | PRN

## 2021-12-15 SURGICAL SUPPLY — 66 items
ADH SKN CLS APL DERMABOND .7 (GAUZE/BANDAGES/DRESSINGS) ×1
APL SKNCLS STERI-STRIP NONHPOA (GAUZE/BANDAGES/DRESSINGS)
BAG COUNTER SPONGE SURGICOUNT (BAG) ×2 IMPLANT
BAG SPNG CNTER NS LX DISP (BAG) ×1
BENZOIN TINCTURE PRP APPL 2/3 (GAUZE/BANDAGES/DRESSINGS) IMPLANT
BIT DRILL PLIF MAS DISP 5.5MM (DRILL) IMPLANT
BLADE CLIPPER SURG (BLADE) IMPLANT
BUR MATCHSTICK NEURO 3.0 LAGG (BURR) ×2 IMPLANT
BUR PRECISION FLUTE 5.0 (BURR) ×2 IMPLANT
CANISTER SUCT 3000ML PPV (MISCELLANEOUS) ×2 IMPLANT
CAP RELINE MOD TULIP RMM (Cap) ×4 IMPLANT
CARTRIDGE OIL MAESTRO DRILL (MISCELLANEOUS) ×1 IMPLANT
CASCADIA AN CONVEX 8.5X28X11 (Cage) ×4 IMPLANT
CNTNR URN SCR LID CUP LEK RST (MISCELLANEOUS) ×1 IMPLANT
CONT SPEC 4OZ STRL OR WHT (MISCELLANEOUS) ×2
COVER BACK TABLE 60X90IN (DRAPES) ×2 IMPLANT
DECANTER SPIKE VIAL GLASS SM (MISCELLANEOUS) ×2 IMPLANT
DERMABOND ADVANCED (GAUZE/BANDAGES/DRESSINGS) ×1
DERMABOND ADVANCED .7 DNX12 (GAUZE/BANDAGES/DRESSINGS) ×1 IMPLANT
DIFFUSER DRILL AIR PNEUMATIC (MISCELLANEOUS) ×2 IMPLANT
DRAPE C-ARM 42X72 X-RAY (DRAPES) ×4 IMPLANT
DRAPE C-ARMOR (DRAPES) ×1 IMPLANT
DRAPE LAPAROTOMY 100X72X124 (DRAPES) ×2 IMPLANT
DRAPE SURG 17X23 STRL (DRAPES) ×2 IMPLANT
DRILL PLIF MAS DISP 5.5MM (DRILL) ×2
DURAPREP 26ML APPLICATOR (WOUND CARE) ×2 IMPLANT
ELECT REM PT RETURN 9FT ADLT (ELECTROSURGICAL) ×2
ELECTRODE REM PT RTRN 9FT ADLT (ELECTROSURGICAL) ×1 IMPLANT
GAUZE 4X4 16PLY ~~LOC~~+RFID DBL (SPONGE) IMPLANT
GAUZE SPONGE 4X4 12PLY STRL (GAUZE/BANDAGES/DRESSINGS) IMPLANT
GLOVE EXAM NITRILE XL STR (GLOVE) IMPLANT
GLOVE SURG LTX SZ6.5 (GLOVE) ×4 IMPLANT
GOWN STRL REUS W/ TWL LRG LVL3 (GOWN DISPOSABLE) ×2 IMPLANT
GOWN STRL REUS W/ TWL XL LVL3 (GOWN DISPOSABLE) IMPLANT
GOWN STRL REUS W/TWL 2XL LVL3 (GOWN DISPOSABLE) IMPLANT
GOWN STRL REUS W/TWL LRG LVL3 (GOWN DISPOSABLE) ×4
GOWN STRL REUS W/TWL XL LVL3 (GOWN DISPOSABLE)
INTERBODY CSCD AN CVX8.5X28X11 (Cage) IMPLANT
KIT BASIN OR (CUSTOM PROCEDURE TRAY) ×2 IMPLANT
KIT POSITION SURG JACKSON T1 (MISCELLANEOUS) ×2 IMPLANT
KIT TURNOVER KIT B (KITS) ×2 IMPLANT
MILL MEDIUM DISP (BLADE) IMPLANT
NDL HYPO 25X1 1.5 SAFETY (NEEDLE) ×1 IMPLANT
NDL SPNL 18GX3.5 QUINCKE PK (NEEDLE) IMPLANT
NEEDLE HYPO 25X1 1.5 SAFETY (NEEDLE) ×2 IMPLANT
NEEDLE SPNL 18GX3.5 QUINCKE PK (NEEDLE) IMPLANT
NS IRRIG 1000ML POUR BTL (IV SOLUTION) ×2 IMPLANT
OIL CARTRIDGE MAESTRO DRILL (MISCELLANEOUS) ×2
PACK LAMINECTOMY NEURO (CUSTOM PROCEDURE TRAY) ×2 IMPLANT
PAD ARMBOARD 7.5X6 YLW CONV (MISCELLANEOUS) ×4 IMPLANT
ROD RELINE COCR LORD 5X30 (Rod) ×2 IMPLANT
SCREW LOCK RSS 4.5/5.0MM (Screw) ×4 IMPLANT
SCREW SHANK RELINE MOD 6.5X35 (Screw) ×4 IMPLANT
SLEEVE SURGEON STRL (DRAPES) ×1 IMPLANT
SPONGE SURGIFOAM ABS GEL 100 (HEMOSTASIS) ×2 IMPLANT
SPONGE T-LAP 4X18 ~~LOC~~+RFID (SPONGE) IMPLANT
STRIP CLOSURE SKIN 1/2X4 (GAUZE/BANDAGES/DRESSINGS) IMPLANT
SUT PROLENE 6 0 BV (SUTURE) IMPLANT
SUT VIC AB 0 CT1 18XCR BRD8 (SUTURE) ×1 IMPLANT
SUT VIC AB 0 CT1 8-18 (SUTURE) ×2
SUT VIC AB 2-0 CT1 18 (SUTURE) ×2 IMPLANT
SUT VIC AB 3-0 SH 8-18 (SUTURE) ×2 IMPLANT
TOWEL GREEN STERILE (TOWEL DISPOSABLE) ×2 IMPLANT
TOWEL GREEN STERILE FF (TOWEL DISPOSABLE) ×2 IMPLANT
TRAY FOLEY MTR SLVR 16FR STAT (SET/KITS/TRAYS/PACK) ×2 IMPLANT
WATER STERILE IRR 1000ML POUR (IV SOLUTION) ×2 IMPLANT

## 2021-12-15 NOTE — Anesthesia Procedure Notes (Signed)
Procedure Name: Intubation Date/Time: 12/15/2021 2:04 PM Performed by: Rosiland Oz, CRNA Pre-anesthesia Checklist: Patient identified, Emergency Drugs available, Suction available, Patient being monitored and Timeout performed Patient Re-evaluated:Patient Re-evaluated prior to induction Oxygen Delivery Method: Circle system utilized Preoxygenation: Pre-oxygenation with 100% oxygen Induction Type: IV induction Ventilation: Mask ventilation without difficulty Laryngoscope Size: Miller and 3 Grade View: Grade I Tube type: Oral Tube size: 7.5 mm Number of attempts: 1 Airway Equipment and Method: Stylet Placement Confirmation: ETT inserted through vocal cords under direct vision, positive ETCO2 and breath sounds checked- equal and bilateral Secured at: 22 cm Tube secured with: Tape Dental Injury: Teeth and Oropharynx as per pre-operative assessment

## 2021-12-15 NOTE — Op Note (Signed)
12/15/2021  7:15 PM  PATIENT:  Curtis Mendez  56 y.o. male With significant pain in his left lower extremity and spondylolisthesis. 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  BLOOD ADMINISTERED:none  CELL SAVER GIVEN:none used  COUNT:per nursing  DRAINS: none   SPECIMEN:  No Specimen  DICTATION: Curtis Mendez is a 56 y.o. male whom was taken to the operating room intubated, and placed under a general anesthetic without difficulty. A foley catheter was placed under sterile conditions. He was positioned prone on a Jackson table with all pressure points properly padded.  His lumbar region was prepped and draped in a sterile manner. I infiltrated 200cc's 1/2%lidocaine/1:2000,000 strength epinephrine into the planned incision. I opened the skin with a 10 blade and took the incision down to the thoracolumbar fascia. I exposed the lamina of L3,4,and 5 in a subperiosteal fashion bilaterally. I confirmed my location with an intraoperative xray.  I placed self retaining retractors and started the decompression.  I decompressed the spinal canal via inferior facetectomies of L4, and partial superior facetectomies of L5. Partial laminectomies were performed on L4 bilaterally. I removed bone and ligament to decompress the lateral recesses, and the L4/5 foramina. The central  canal was decompressed. The bone removed was well beyond the needed exposure for a PLIF at L4/5. I used the drill and Kerrison punches to remove the bone and ligamentum flavum.  PLIF's were performed at L4/5 in the same fashion. I opened the disc space with a 15 blade then used a variety of instruments to remove the disc and prepare the space for the arthrodesis. I used curettes, rongeurs, punches, shavers for the disc space, and rasps in the discetomy. I measured the disc space and placed 11x38mm  titanium cages(Stryker) packed with autograft morsels into the disc space(s).   I placed pedicle screws at L4 and L5, using fluoroscopic guidance. I drilled a pilot hole, then cannulated the pedicle with a drill at each site. I then tapped each pedicle, assessing each site for pedicle violations. No cutouts were appreciated. Screws (nuvasive relign) were then placed at each site without difficulty. I attached rods and locking caps with the appropriate tools. The locking caps were secured with torque limited screwdrivers. Final films were performed and the final construct appeared to be in good position.  I closed the wound in a layered fashion. I approximated the thoracolumbar fascia, subcutaneous, and subcuticular planes with vicryl sutures. I used Dermabond, and an occlusive bandage for a sterile dressing.     PLAN OF CARE: Admit to inpatient   PATIENT DISPOSITION:  PACU - hemodynamically stable.   Delay start of Pharmacological VTE agent (>24hrs) due to surgical blood loss or risk of bleeding:  yes

## 2021-12-15 NOTE — H&P (Signed)
BP (!) 141/95    Pulse 81    Temp 98.3 F (36.8 C)    Resp 17    Ht 5\' 7"  (1.702 m)    Wt 93 kg    SpO2 98%    BMI 32.11 kg/m   Curtis Mendez comes in today for evaluation of pain which he has had in his left buttock and leg and has been intense, unrelenting, and increasingly worse over the last 2 weeks.  He has lost strength in his lower extremity and is walking with crutches because he is afraid of his balance.  He says he almost gave out when getting up out of bed and switched to the crutches so he would not fear of falling.     VITAL SIGNS :  Temperature is 97.7.  He is 5 feet 7 inches.  He weighs 202 pounds. Blood pressure is 125/85, pulse 99, respiratory rate 20.  Pain is 10/10.       He is in obvious discomfort.  He sits in a chair, but we will not place weight on his left hip.  He also states that when he received an injection into the left hip that his symptoms got worse.  He says he cannot sit or stand without pain.  He is taking oral Prednisone and muscle relaxants without benefit.  He says he finds it very difficult to sleep.  He has absolutely no discomfort on his right side.  He has had pain like this in the past, except the severity and the time span was not the same.  He says over the last few years, at least 2, when he has cut the lawn he has been down and out for a few days as a result of his problem.  This has been something that has been building up, but has not been relieved at this time.       MRI shows a listhesed L4 and L5, a degenerated disc, and significant facet arthropathy causing severe lateral recess stenosis bilaterally.  The rest of the spine actually looks pretty good.  The conus is normal.  The cauda equina is normal.  No abnormalities in the paraspinous soft tissue.  No infections either.     PHYSICAL EXAMINATION :  Curtis Mendez on exam is alert and oriented by 4.  He answers all questions appropriately. Obvious distress.  He is unable to toe walk on the left  side.  He is unable to heel walk on the left side.  He has weakness in the hip flexors on the left and quadriceps on the left and dorsiflexion on the left.  Right side has normal strength.  Upper extremities are normal in strength.  2+ reflexes of biceps, triceps, brachioradialis, right knee, right ankle; 1+ left knee, not elicited left ankle.  He has intact pinprick and proprioception in the upper and lower extremities bilaterally.  Romberg is negative.  Coordination overall is normal.     IMAGING :  MRI is reviewed from January 2023 and what it shows is a listhesed level of L4-5, along with the lateral recess stenosis and the foraminal narrowing present on both the right and left side at L4-5.  The conus is normal.  The cauda equina is normal.     SUMMARY :  Curtis Mendez has lateral recess stenosis, spinal stenosis, and spondylolisthesis of L4 and L5.  He is profoundly weak in his left lower extremity, the hip flexors, quadriceps, and the dorsiflexors.  Given the fact  that his weakness is, by his recollection, getting worse and the fact that he is weak on objective examination, I do believe he needs to undergo operative decompression at L4-5 to decompress the L4 and L5 roots.  This would be done via posterior lumbar interbody arthrodesis and pedicle screw fixation nonsegmental.  I gave him a detailed sheet with regard to the risks and benefits of the procedure.  We are below the conus, so paralysis is not a typical problem with this operation.  The biggest risks are fusion failure, hardware failure, and no relief of his discomfort.  I don't see anything else in the lumbar region that I think would be causing his pain.  I did tell him it is slightly unusual to have a chronic problem like listhesis and facet arthropathy, which he does have, causing the lateral recess and foraminal narrowing to present in such an acute fashion, but it certainly is there.  He agrees and is willing to undergo this procedure.   We will try to get this done next week.

## 2021-12-15 NOTE — Transfer of Care (Signed)
Immediate Anesthesia Transfer of Care Note  Patient: Curtis Mendez  Procedure(s) Performed: Lumbar four-five Posterior Lumbar Interbody Fusion (Back)  Patient Location: PACU  Anesthesia Type:General  Level of Consciousness: lethargic and responds to stimulation  Airway & Oxygen Therapy: Patient Spontanous Breathing and Patient connected to face mask oxygen  Post-op Assessment: Report given to RN  Post vital signs: Reviewed and stable  Last Vitals:  Vitals Value Taken Time  BP 118/74   Temp    Pulse 85 12/15/21 1817  Resp 7 12/15/21 1817  SpO2 98 % 12/15/21 1817  Vitals shown include unvalidated device data.  Last Pain:  Vitals:   12/15/21 0953  PainSc: 5       Patients Stated Pain Goal: 0 (12/15/21 0953)  Complications: No notable events documented.

## 2021-12-15 NOTE — Anesthesia Preprocedure Evaluation (Signed)
Anesthesia Evaluation  Patient identified by MRN, date of birth, ID band Patient awake    Reviewed: Allergy & Precautions, NPO status , Patient's Chart, lab work & pertinent test results  History of Anesthesia Complications Negative for: history of anesthetic complications  Airway Mallampati: I  TM Distance: >3 FB Neck ROM: Full    Dental  (+) Edentulous Upper, Dental Advisory Given   Pulmonary neg pulmonary ROS,    breath sounds clear to auscultation       Cardiovascular hypertension, Pt. on medications and Pt. on home beta blockers (-) angina Rhythm:Regular Rate:Normal     Neuro/Psych  Headaches, Back pain    GI/Hepatic negative GI ROS, Neg liver ROS,   Endo/Other  obese  Renal/GU negative Renal ROS     Musculoskeletal  (+) Arthritis ,   Abdominal (+) + obese,   Peds  Hematology negative hematology ROS (+)   Anesthesia Other Findings   Reproductive/Obstetrics                             Anesthesia Physical Anesthesia Plan  ASA: 3  Anesthesia Plan: General   Post-op Pain Management: Tylenol PO (pre-op)*   Induction: Intravenous  PONV Risk Score and Plan: 2 and Ondansetron and Dexamethasone  Airway Management Planned: Oral ETT  Additional Equipment: None  Intra-op Plan:   Post-operative Plan: Extubation in OR  Informed Consent: I have reviewed the patients History and Physical, chart, labs and discussed the procedure including the risks, benefits and alternatives for the proposed anesthesia with the patient or authorized representative who has indicated his/her understanding and acceptance.     Dental advisory given  Plan Discussed with: CRNA and Surgeon  Anesthesia Plan Comments:         Anesthesia Quick Evaluation

## 2021-12-16 DIAGNOSIS — M48062 Spinal stenosis, lumbar region with neurogenic claudication: Secondary | ICD-10-CM | POA: Diagnosis not present

## 2021-12-16 DIAGNOSIS — M4316 Spondylolisthesis, lumbar region: Secondary | ICD-10-CM | POA: Diagnosis not present

## 2021-12-16 LAB — CBC
HCT: 36.4 % — ABNORMAL LOW (ref 39.0–52.0)
Hemoglobin: 12.5 g/dL — ABNORMAL LOW (ref 13.0–17.0)
MCH: 28.4 pg (ref 26.0–34.0)
MCHC: 34.3 g/dL (ref 30.0–36.0)
MCV: 82.7 fL (ref 80.0–100.0)
Platelets: 245 10*3/uL (ref 150–400)
RBC: 4.4 MIL/uL (ref 4.22–5.81)
RDW: 12.6 % (ref 11.5–15.5)
WBC: 10.7 10*3/uL — ABNORMAL HIGH (ref 4.0–10.5)
nRBC: 0 % (ref 0.0–0.2)

## 2021-12-16 LAB — CREATININE, SERUM
Creatinine, Ser: 0.79 mg/dL (ref 0.61–1.24)
GFR, Estimated: >60 mL/min (ref >60)

## 2021-12-16 MED ORDER — OXYCODONE HCL 10 MG PO TABS: 10.0000 mg | ORAL_TABLET | ORAL | 0 refills | Status: AC | PRN

## 2021-12-16 NOTE — Progress Notes (Signed)
Pt with d/c orders to home. Assessment complete pt stable. IV removed. Prescriptions sent to pharmacy pt aware. Pt provided with d/c education and packet all questions answered. Pt and all belongings transported to private vehicle via wheelchair.

## 2021-12-16 NOTE — Evaluation (Signed)
Physical Therapy Evaluation Patient Details Name: Curtis Mendez MRN: [id number] DOB: 06/18/1966 Today's Date: 12/16/2021  History of Present Illness  Patient is a 56 year old male presenting post L4-5 lumbar fusion after diagnosis of bilateral lumbar stenosis. PMH includes: DM 2, obesity, HTN, RA and OA  Clinical Impression  Pt presents to PT s/p back surgery with deficits in power. Pt is able to complete all mobility required in the home at a modI level. Pt expresses confidence in his mobility at this time and demonstrates the ability to maintain back precautions during mobility. Pt is encouraged to mobilize frequently to continue improving activity tolerance. Pt requires no further acute PT services. Acute PT signing off.       Recommendations for follow up therapy are one component of a multi-disciplinary discharge planning process, led by the attending physician.  Recommendations may be updated based on patient status, additional functional criteria and insurance authorization.  Follow Up Recommendations No PT follow up    Assistance Recommended at Discharge PRN  Patient can return home with the following  A little help with bathing/dressing/bathroom;Assistance with cooking/housework;Assist for transportation    Equipment Recommendations None recommended by PT  Recommendations for Other Services       Functional Status Assessment Patient has had a recent decline in their functional status and demonstrates the ability to make significant improvements in function in a reasonable and predictable amount of time.     Precautions / Restrictions Precautions Precautions: Back Precaution Booklet Issued: Yes (comment) Required Braces or Orthoses:  (no brace needed per orders) Restrictions Weight Bearing Restrictions: No      Mobility  Bed Mobility Overal bed mobility: Modified Independent             General bed mobility comments: HOB elevated, verbal cues for instruction in  log roll    Transfers Overall transfer level: Independent Equipment used: None                    Ambulation/Gait Ambulation/Gait assistance: Independent Gait Distance (Feet): 250 Feet Assistive device: None Gait Pattern/deviations: WFL(Within Functional Limits) Gait velocity: functional Gait velocity interpretation: >2.62 ft/sec, indicative of community ambulatory   General Gait Details: steady step-through gait  Stairs Stairs: Yes Stairs assistance: Modified independent (Device/Increase time) Stair Management: One rail Right, Alternating pattern Number of Stairs: 2    Wheelchair Mobility    Modified Rankin (Stroke Patients Only)       Balance Overall balance assessment: No apparent balance deficits (not formally assessed)                                           Pertinent Vitals/Pain Pain Assessment Pain Assessment: Faces Faces Pain Scale: Hurts a little bit Pain Location: low back Pain Descriptors / Indicators: Burning Pain Intervention(s): Monitored during session    Home Living Family/patient expects to be discharged to:: Private residence Living Arrangements: Spouse/significant other Available Help at Discharge: Family;Available 24 hours/day Type of Home: House Home Access: Stairs to enter Entrance Stairs-Rails: None Entrance Stairs-Number of Steps: 2   Home Layout: One level Home Equipment: Agricultural consultantolling Walker (2 wheels);Cane - single point;Crutches      Prior Function Prior Level of Function : Independent/Modified Independent;Driving                     Hand Dominance  Extremity/Trunk Assessment   Upper Extremity Assessment Upper Extremity Assessment: Overall WFL for tasks assessed    Lower Extremity Assessment Lower Extremity Assessment: Overall WFL for tasks assessed    Cervical / Trunk Assessment Cervical / Trunk Assessment: Back Surgery  Communication   Communication: No difficulties   Cognition Arousal/Alertness: Awake/alert Behavior During Therapy: WFL for tasks assessed/performed Overall Cognitive Status: Within Functional Limits for tasks assessed                                          General Comments General comments (skin integrity, edema, etc.): VSS on RA    Exercises     Assessment/Plan    PT Assessment Patient does not need any further PT services  PT Problem List         PT Treatment Interventions      PT Goals (Current goals can be found in the Care Plan section)       Frequency       Co-evaluation               AM-PAC PT "6 Clicks" Mobility  Outcome Measure Help needed turning from your back to your side while in a flat bed without using bedrails?: None Help needed moving from lying on your back to sitting on the side of a flat bed without using bedrails?: None Help needed moving to and from a bed to a chair (including a wheelchair)?: None Help needed standing up from a chair using your arms (e.g., wheelchair or bedside chair)?: None Help needed to walk in hospital room?: None Help needed climbing 3-5 steps with a railing? : None 6 Click Score: 24    End of Session   Activity Tolerance: Patient tolerated treatment well Patient left: in chair;with call bell/phone within reach Nurse Communication: Mobility status PT Visit Diagnosis: Other abnormalities of gait and mobility (R26.89)    Time: 4098-1191 PT Time Calculation (min) (ACUTE ONLY): 32 min   Charges:   PT Evaluation $PT Eval Low Complexity: 1 Low          Arlyss Gandy, PT, DPT Acute Rehabilitation Pager: [phone] Office [phone]   Arlyss Gandy 12/16/2021, 9:59 AM

## 2021-12-16 NOTE — Progress Notes (Signed)
°  Transition of Care [facility]) Screening Note   Patient Details  Name: Curtis Mendez Date of Birth: 19-Jul-1966   Transition of Care [facility]) CM/SW Contact:    Windle Guard, LCSW Phone Number: 12/16/2021, 8:28 AM    Transition of Care Department [facility]) has reviewed patient and noted no immediate TOC needs pending continued medical work-up. TOC team will continue to monitor patient advancement through interdisciplinary progression rounds to support any identified discharge supports as needed. If new patient transition needs arise, please place a TOC consult or reach out to [facility] team.

## 2021-12-16 NOTE — Care Management Obs Status (Signed)
[place] NOTIFICATION   Patient Details  Name: Curtis Mendez MRN: [id number] Date of Birth: Apr 20, 1966   Medicare Observation Status Notification Given:  Yes (patient chose to leave prior to [facility] providing letter. agreeable to verbal signature. no questions voiced)    Konrad Penta, RN 12/16/2021, 11:55 AM

## 2021-12-16 NOTE — Care Management CC44 (Signed)
Condition Code 44 Documentation Completed  Patient Details  Name: Curtis Mendez  MRN: [id number] Date of Birth: 02/20/1966   Condition Code 44 given:  Yes Patient signature on Condition Code 44 notice:  Yes Documentation of 2 MD's agreement:  Yes Code 44 added to claim:  Yes    Kallie LocksHall,  [place], RN 12/16/2021, 11:56 AM

## 2021-12-16 NOTE — Progress Notes (Signed)
OT Screen Note  Patient Details Name: Curtis Mendez  MRN: [id number] DOB: 07/26/1966   OT Screen: Patient working with PT at OT time of arrival. Has support at home, able to complete ADLs adhering to back precautions. No needs identified, OT to sign off at this time.   Pollyann GlenMary Kate E. , OTR/L Acute Rehabilitation Services [phone]        Cherlyn CushingMary Kate  12/16/2021, 9:16 AM

## 2021-12-16 NOTE — Anesthesia Postprocedure Evaluation (Signed)
Anesthesia Post Note  Patient: Curtis Mendez  Procedure(s) Performed: Lumbar four-five Posterior Lumbar Interbody Fusion (Back)     Patient location during evaluation: PACU Anesthesia Type: General Level of consciousness: awake Pain management: pain level controlled Vital Signs Assessment: post-procedure vital signs reviewed and stable Respiratory status: spontaneous breathing, nonlabored ventilation, respiratory function stable and patient connected to nasal cannula oxygen Cardiovascular status: blood pressure returned to baseline and stable Postop Assessment: no apparent nausea or vomiting Anesthetic complications: no   No notable events documented.  Last Vitals:  Vitals:   12/15/21 2305 12/16/21 0332  BP: 134/81 (!) 144/76  Pulse: (!) 103 83  Resp: 14 (!) 9  Temp: 36.6 C 37 C  SpO2: 92% 97%    Last Pain:  Vitals:   12/16/21 0724  TempSrc:   PainSc: 0-No pain                  P 

## 2021-12-16 NOTE — Discharge Summary (Signed)
°  Physician Discharge Summary  Patient ID: Curtis Mendez MRN: [id number] DOB/AGE: 05/13/1966 56 y.o. Estimated body mass index is 32.11 kg/m as calculated from the following:   Height as of this encounter:  (1.702 m).   Weight as of this encounter: 93 kg.   Admit date: 12/15/2021 Discharge date: 12/16/2021  Admission Diagnoses: Lumbar spinal stenosis degenerative disease L4-5  Discharge Diagnoses: Same Principal Problem:   Spondylolisthesis at L4-L5 level   Discharged Condition: good  Hospital Course: Patient admitted to hospital on 1 decompressive laminectomy interbody fusion at L4-5.  Postoperative patient did very well covering the floor on the floor was ambulating voiding spontaneously tolerating regular diet and stable for discharge home.  Patient be discharged scheduled follow-up with Dr. Franky Macho.  Consults: Significant Diagnostic Studies: Treatments: Posterior lumbar interbody fusion L4-5 Discharge Exam: Blood pressure (!) 145/85, pulse 96, temperature 98.4 F (36.9 C), temperature source Oral, resp. rate 15, height  (1.702 m), weight 93 kg, SpO2 95 %. Strength 5 out of 5 on clean dry and intact  Disposition: Home   Allergies as of 12/16/2021       Reactions   Vicodin [hydrocodone-acetaminophen] Other (See Comments)   Pressure in chest   Lisinopril Swelling        Medication List     TAKE these medications    amLODipine 10 MG tablet Commonly known as: NORVASC Take 10 mg by mouth daily.   cyclobenzaprine 10 MG tablet Commonly known as: FLEXERIL Take 1 tablet (10 mg total) by mouth 3 (three) times daily as needed for muscle spasms.   losartan-hydrochlorothiazide 100-25 MG tablet Commonly known as: HYZAAR Take 1 tablet by mouth daily.   metoprolol tartrate 100 MG tablet Commonly known as: LOPRESSOR Take 1 tablet by mouth 2 (two) times daily.   Oxycodone HCl 10 MG Tabs Take 1 tablet (10 mg total) by mouth every 3 (three) hours as needed  for severe pain ((score 7 to 10)).         Signed: Mariam Dollar 12/16/2021, 9:07 AM

## 2022-01-04 DIAGNOSIS — M4316 Spondylolisthesis, lumbar region: Secondary | ICD-10-CM | POA: Diagnosis not present

## 2022-01-04 DIAGNOSIS — M48061 Spinal stenosis, lumbar region without neurogenic claudication: Secondary | ICD-10-CM | POA: Diagnosis not present

## 2022-01-04 DIAGNOSIS — I10 Essential (primary) hypertension: Secondary | ICD-10-CM | POA: Diagnosis not present

## 2022-01-04 DIAGNOSIS — Z6831 Body mass index (BMI) 31.0-31.9, adult: Secondary | ICD-10-CM | POA: Diagnosis not present

## 2022-01-31 DIAGNOSIS — I10 Essential (primary) hypertension: Secondary | ICD-10-CM | POA: Diagnosis not present

## 2022-01-31 DIAGNOSIS — E669 Obesity, unspecified: Secondary | ICD-10-CM | POA: Diagnosis not present

## 2022-01-31 DIAGNOSIS — E7800 Pure hypercholesterolemia, unspecified: Secondary | ICD-10-CM | POA: Diagnosis not present

## 2022-01-31 DIAGNOSIS — E1169 Type 2 diabetes mellitus with other specified complication: Secondary | ICD-10-CM | POA: Diagnosis not present

## 2022-04-03 DIAGNOSIS — M4316 Spondylolisthesis, lumbar region: Secondary | ICD-10-CM | POA: Diagnosis not present

## 2022-04-03 DIAGNOSIS — M48061 Spinal stenosis, lumbar region without neurogenic claudication: Secondary | ICD-10-CM | POA: Diagnosis not present

## 2022-04-03 DIAGNOSIS — Z6832 Body mass index (BMI) 32.0-32.9, adult: Secondary | ICD-10-CM | POA: Diagnosis not present

## 2023-06-11 ENCOUNTER — Other Ambulatory Visit (INDEPENDENT_AMBULATORY_CARE_PROVIDER_SITE_OTHER): Payer: Medicare Other

## 2023-06-11 ENCOUNTER — Ambulatory Visit: Payer: Medicare Other | Admitting: Orthopaedic Surgery

## 2023-06-11 DIAGNOSIS — Z96652 Presence of left artificial knee joint: Secondary | ICD-10-CM

## 2023-06-11 DIAGNOSIS — G8929 Other chronic pain: Secondary | ICD-10-CM

## 2023-06-11 DIAGNOSIS — M25562 Pain in left knee: Secondary | ICD-10-CM

## 2023-06-11 MED ORDER — LIDOCAINE HCL 1 % IJ SOLN
3.0000 mL | INTRAMUSCULAR | Status: AC | PRN
  Administered 2023-06-11: 3 mL

## 2023-06-11 MED ORDER — METHYLPREDNISOLONE ACETATE 40 MG/ML IJ SUSP
40.0000 mg | INTRAMUSCULAR | Status: AC | PRN
  Administered 2023-06-11: 40 mg via INTRA_ARTICULAR

## 2023-06-11 NOTE — Progress Notes (Signed)
The patient is a 57 year old gentleman that we have not seen in a long period of time.  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  He says at times now has been having swelling of that knee and taking steps after he gets up he feels like the knee has been painful and not stable to him.  He denies any recent injuries.  He has had spinal surgery last year.  He is on blood pressure medications.  He has had no acute changes recently his medical status.  He is not a diabetic.  Examination of his right knee shows just a slight effusion.  Knee feels ligamentously stable to me on exam he has good range of motion of the but I can see where it is painful to him.  2 views of his right knee reviewed with him.  He does have well-seated components and I see no complicating features of the components grossly.  I did feel it was reasonable to try to aspirate fluid from his knee today.  Fortunately I was able to only get about 15 cc of clear fluid from the knee.  It does not appear to be infected.  I did place a small steroid injection back in his knee.  He will work on Dance movement psychotherapist exercises.  I have recommended a knee sleeve to consider.  I would like to see him back in 4 weeks to see how he is doing overall.  He agrees with this treatment plan.  All questions concerns were addressed and answered.    Procedure Note  Patient: Curtis Mendez             Date of Birth: 1966-02-08           MRN: [id number]             Visit Date: 06/11/2023  Procedures: Visit Diagnoses:  1. History of left knee replacement   2. Chronic pain of left knee     Large Joint Inj: L knee on 06/11/2023 9:16 AM Indications: diagnostic evaluation and pain Details: 22 G 1.5 in needle, superolateral approach  Arthrogram: No  Medications: 3 mL lidocaine 1 %; 40 mg methylPREDNISolone acetate 40 MG/ML Outcome: tolerated  well, no immediate complications Procedure, treatment alternatives, risks and benefits explained, specific risks discussed. Consent was given by the patient. Immediately prior to procedure a time out was called to verify the correct patient, procedure, equipment, support staff and site/side marked as required. Patient was prepped and draped in the usual sterile fashion.

## 2023-07-10 ENCOUNTER — Encounter: Payer: Self-pay | Admitting: Orthopaedic Surgery

## 2023-07-10 ENCOUNTER — Ambulatory Visit (INDEPENDENT_AMBULATORY_CARE_PROVIDER_SITE_OTHER): Payer: Medicare Other | Admitting: Orthopaedic Surgery

## 2023-07-10 DIAGNOSIS — Z96652 Presence of left artificial knee joint: Secondary | ICD-10-CM | POA: Diagnosis not present

## 2023-07-10 NOTE — Progress Notes (Signed)
The patient comes in today as a relates to follow-up for his left knee.  He had had 3 surgeries on that knee done elsewhere.  It is a knee revision.  His x-rays show well-seated implants at his last visit.  I did take some fluid off his knee and we have put him through quad strengthening exercises.  He says this has helped and so far he is doing well.  His left knee is not warm.  He has full range of motion of that knee.  His incision is well-healed.  There is no effusion today.  At this point follow-up for his knee can be as needed but if he does develop any issues with that left knee revision arthroplasty he can come see [place] at any time.  All questions and concerns were addressed and answered.

## 2023-12-04 IMAGING — RF DG LUMBAR SPINE 2-3V
1 series · 2 of 2 positions shown · non-contrast
Comparison: None.

CLINICAL DATA: Posterior lumbar interbody fusion

EXAM:
LUMBAR SPINE - 2-3 VIEW

[Series 1: run · 2 of 2 slices shown]
[im 1/2]
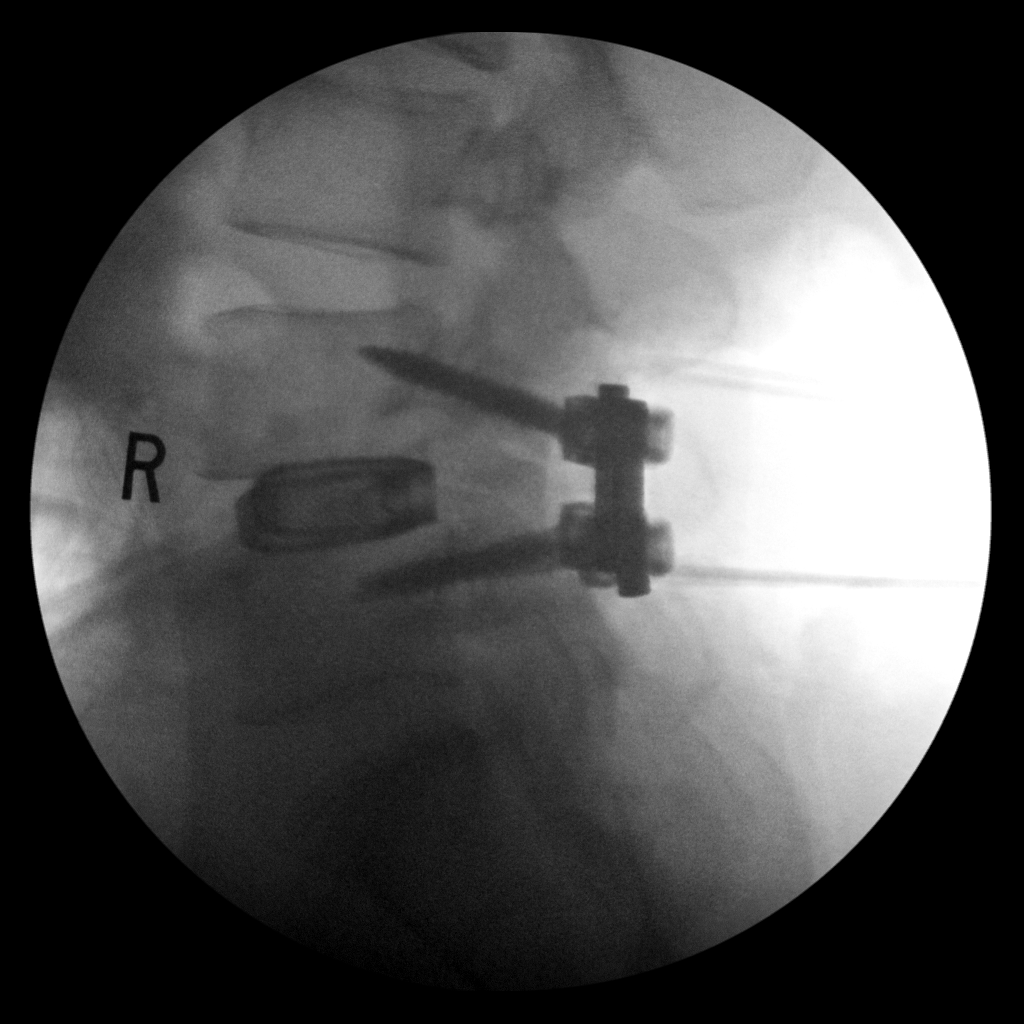
[im 2/2]
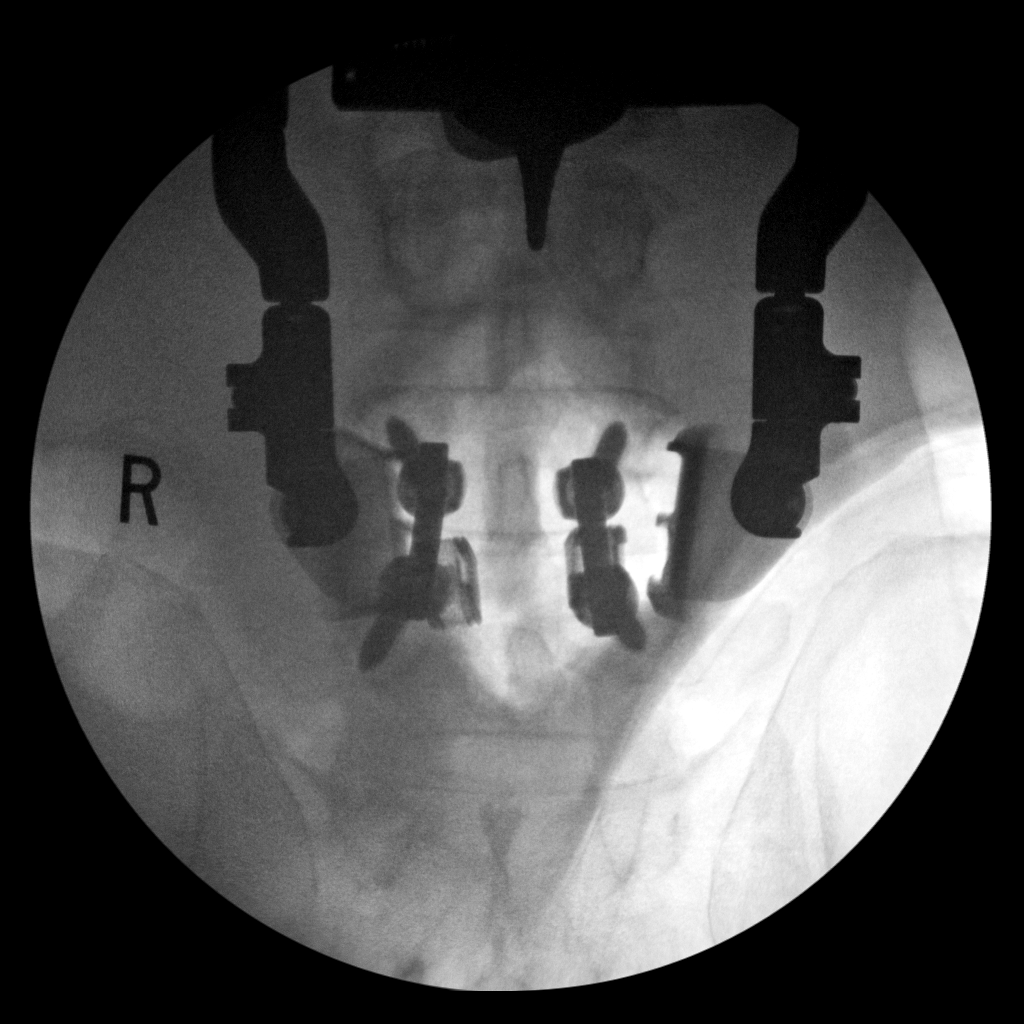

[2 of 2 positions shown; findings below may reference images not displayed]

FINDINGS: Intraoperative images during L4-L5 posterior and interbody fusion.
No evidence of immediate hardware complication.
IMPRESSION: Intraoperative images during L4-L5 posterior and interbody fusion.
No evidence of immediate hardware complication.

## 2024-05-25 ENCOUNTER — Ambulatory Visit (INDEPENDENT_AMBULATORY_CARE_PROVIDER_SITE_OTHER): Admitting: Orthopaedic Surgery

## 2024-05-25 ENCOUNTER — Other Ambulatory Visit (INDEPENDENT_AMBULATORY_CARE_PROVIDER_SITE_OTHER): Payer: Self-pay

## 2024-05-25 ENCOUNTER — Encounter: Payer: Self-pay | Admitting: Orthopaedic Surgery

## 2024-05-25 DIAGNOSIS — M25511 Pain in right shoulder: Secondary | ICD-10-CM

## 2024-05-25 MED ORDER — LIDOCAINE HCL 1 % IJ SOLN
3.0000 mL | INTRAMUSCULAR | Status: AC | PRN
  Administered 2024-05-25: 3 mL

## 2024-05-25 MED ORDER — METHYLPREDNISOLONE ACETATE 40 MG/ML IJ SUSP
40.0000 mg | INTRAMUSCULAR | Status: AC | PRN
  Administered 2024-05-25: 40 mg via INTRA_ARTICULAR

## 2024-05-25 NOTE — Progress Notes (Signed)
 The patient is a 58 year old patient of ours that we have seen for several things in the past.  He has a remote history of a left shoulder rotator cuff repair that was done very long time ago.  He has been having right shoulder pain for several weeks now he was reaching out to get a glass of water was doing yard work when he felt something in the shoulder and has been very painful since then with decreased motion as well.  On exam he is able to abduct his right shoulder appropriately using his rotator cuff and his deltoid to but it is painful.  His external rotation is full and his internal rotation with adduction is full.  His liftoff is negative.  He has good strength with external rotation as well as abduction.  He does have positive Neer and Hawkins signs and pain around the [facility] joint.  3 views of the right shoulder show no acute findings.  The humeral head is well located and there is adequate and abundant space in the subacromial outlet.  There is moderate changes of the AC joint in terms of arthritis.  I did recommend a steroid injection in the subacromial outlet which he agreed to and tolerated well.  Will see him back in a month.  If his pain has not improved this would warrant a MRI of his right shoulder.  He will continue to work on range of motion and some strengthening and motion activities on his own at home which we showed him how to do.  All question concerns were answered addressed.    Procedure Note  Patient: Curtis Mendez             Date of Birth: 08-27-1966           MRN: [id number]             Visit Date: 05/25/2024  Procedures: Visit Diagnoses:  1. Acute pain of right shoulder     Large Joint Inj: R subacromial bursa on 05/25/2024 8:51 AM Indications: pain and diagnostic evaluation Details: 22 G 1.5 in needle  Arthrogram: No  Medications: 3 mL lidocaine  1 %; 40 mg methylPREDNISolone  acetate 40 MG/ML Outcome: tolerated well, no immediate complications Procedure,  treatment alternatives, risks and benefits explained, specific risks discussed. Consent was given by the patient. Immediately prior to procedure a time out was called to verify the correct patient, procedure, equipment, support staff and site/side marked as required. Patient was prepped and draped in the usual sterile fashion.

## 2024-06-24 ENCOUNTER — Ambulatory Visit (INDEPENDENT_AMBULATORY_CARE_PROVIDER_SITE_OTHER): Admitting: Orthopaedic Surgery

## 2024-06-24 ENCOUNTER — Encounter: Payer: Self-pay | Admitting: Orthopaedic Surgery

## 2024-06-24 DIAGNOSIS — M25511 Pain in right shoulder: Secondary | ICD-10-CM

## 2024-06-24 NOTE — Progress Notes (Signed)
 The patient comes in for follow-up a month after having steroid injection in his right shoulder subacromial outlet to treat impingement.  He says he feels much better overall.  He says there is little bit of weakness and he can work on weight training but his pain is subsided significantly with the right shoulder.  He has excellent range of motion of the right shoulder and good strength of the rotator cuff overall.  There is no blocks to rotation and his liftoff is negative.    At this point follow-up can be as needed for his shoulder.  However if he does develop any issues he knows to let us  know.  I would still like him to avoid any heavy lifting and overhead exercises for a while and to limit his heavy lifting overhead or repetitive overhead activities.

## 2024-08-31 ENCOUNTER — Encounter: Payer: Self-pay | Admitting: Radiology
